# Patient Record
Sex: Female | Born: 1980 | Race: Black or African American | Hispanic: No | Marital: Single | Smoking: Former smoker
Health system: Southern US, Community
[De-identification: ages and names within clinical notes are randomized; demographics above are authoritative.]

## PROBLEM LIST (undated history)

## (undated) DIAGNOSIS — N920 Excessive and frequent menstruation with regular cycle: Secondary | ICD-10-CM

## (undated) DIAGNOSIS — Z5189 Encounter for other specified aftercare: Secondary | ICD-10-CM

## (undated) DIAGNOSIS — D649 Anemia, unspecified: Secondary | ICD-10-CM

## (undated) DIAGNOSIS — M1990 Unspecified osteoarthritis, unspecified site: Secondary | ICD-10-CM

## (undated) HISTORY — DX: Unspecified osteoarthritis, unspecified site: M19.90

## (undated) HISTORY — PX: MENISCUS REPAIR: SHX5179

## (undated) HISTORY — PX: KNEE SURGERY: SHX244

## (undated) HISTORY — PX: ROBOTIC ASSISTED LAPAROSCOPIC VAGINAL HYSTERECTOMY WITH FIBROID REMOVAL: SHX5387

## (undated) HISTORY — DX: Encounter for other specified aftercare: Z51.89

## (undated) HISTORY — DX: Anemia, unspecified: D64.9

## (undated) HISTORY — PX: KNEE ARTHROSCOPY W/ ACL RECONSTRUCTION: SHX1858

---

## 2014-04-28 ENCOUNTER — Encounter (HOSPITAL_COMMUNITY): Payer: Self-pay | Admitting: Emergency Medicine

## 2014-04-28 ENCOUNTER — Emergency Department (HOSPITAL_COMMUNITY)
Admission: EM | Admit: 2014-04-28 | Discharge: 2014-04-28 | Disposition: A | Discharge status: Home / Self Care | Payer: Non-veteran care | Attending: Emergency Medicine | Admitting: Emergency Medicine

## 2014-04-28 DIAGNOSIS — F17200 Nicotine dependence, unspecified, uncomplicated: Secondary | ICD-10-CM | POA: Insufficient documentation

## 2014-04-28 DIAGNOSIS — N946 Dysmenorrhea, unspecified: Secondary | ICD-10-CM | POA: Insufficient documentation

## 2014-04-28 MED ORDER — ONDANSETRON 4 MG PO TBDP
4.0000 mg | ORAL_TABLET | Freq: Once | ORAL | Status: AC
  Administered 2014-04-28: 4 mg via ORAL
  Filled 2014-04-28: qty 1

## 2014-04-28 MED ORDER — MORPHINE SULFATE 4 MG/ML IJ SOLN
4.0000 mg | Freq: Once | INTRAMUSCULAR | Status: AC
  Administered 2014-04-28: 4 mg via INTRAVENOUS
  Filled 2014-04-28 (×2): qty 1

## 2014-04-28 MED ORDER — HYDROCODONE-ACETAMINOPHEN 5-325 MG PO TABS: 2.0000 | ORAL_TABLET | ORAL | Status: DC | PRN

## 2014-04-28 MED ORDER — ONDANSETRON 4 MG PO TBDP: 4.0000 mg | ORAL_TABLET | Freq: Three times a day (TID) | ORAL | Status: DC | PRN

## 2014-04-28 NOTE — ED Provider Notes (Signed)
Medical screening examination/treatment/procedure(s) were performed by non-physician practitioner and as supervising physician I was immediately available for consultation/collaboration.   EKG Interpretation None        Hoy Morn, MD 04/28/14 [phone]

## 2014-04-28 NOTE — ED Notes (Signed)
Pt started her period today and is c/o menstrual cramping.  Hx of painful periods.

## 2014-04-28 NOTE — Discharge Instructions (Signed)
Take Vicodin as needed for pain. Take zofran as needed for nausea. Refer to attached documents for more information.  °

## 2014-04-28 NOTE — ED Provider Notes (Signed)
CSN: [id number]     Arrival date & time 04/28/14  1410 History   First MD Initiated Contact with Patient 04/28/14 1425     Chief Complaint  Patient presents with  . Dysmenorrhea     (Consider location/radiation/quality/duration/timing/severity/associated sxs/prior Treatment) HPI Comments: Patient is a 33 year old female who presents with abdominal pain that started today that is associated with her menstrual cycle that started today. The pain is located in her lower abdomen and does not radiate. The pain is described as cramping and severe. The pain started gradually and progressively worsened since the onset. Patient reports a history of painful periods. No alleviating/aggravating factors. The patient has tried nothing for symptoms without relief because she cannot keep anything down. Associated symptoms include nausea and vomiting. Patient denies fever, headache, diarrhea, chest pain, SOB, dysuria, constipation.   History reviewed. No pertinent past medical history. History reviewed. No pertinent past surgical history. History reviewed. No pertinent family history. History  Substance Use Topics  . Smoking status: Current Every Day Smoker  . Smokeless tobacco: Not on file  . Alcohol Use: No   OB History   Grav Para Term Preterm Abortions TAB SAB Ect Mult Living                 Review of Systems  Constitutional: Negative for fever, chills and fatigue.  HENT: Negative for trouble swallowing.   Eyes: Negative for visual disturbance.  Respiratory: Negative for shortness of breath.   Cardiovascular: Negative for chest pain and palpitations.  Gastrointestinal: Positive for abdominal pain. Negative for nausea, vomiting and diarrhea.  Genitourinary: Positive for vaginal bleeding. Negative for dysuria and difficulty urinating.  Musculoskeletal: Negative for arthralgias and neck pain.  Skin: Negative for color change.  Neurological: Negative for dizziness and weakness.   Psychiatric/Behavioral: Negative for dysphoric mood.      Allergies  Review of patient's allergies indicates no known allergies.  Home Medications   Prior to Admission medications   Medication Sig Start Date End Date Taking? Authorizing Provider  acetaminophen (TYLENOL) 500 MG tablet Take 500 mg by mouth every 6 (six) hours as needed.   Yes Historical Provider, MD  ibuprofen (ADVIL,MOTRIN) 200 MG tablet Take 200 mg by mouth every 6 (six) hours as needed for mild pain.   Yes Historical Provider, MD   BP 144/98  Pulse 78  Temp(Src) 98.2 F (36.8 C) (Oral)  Resp 18  SpO2 100%  LMP 04/28/2014 Physical Exam  Nursing note and vitals reviewed. Constitutional: She is oriented to person, place, and time. She appears well-developed and well-nourished. No distress.  HENT:  Head: Normocephalic and atraumatic.  Eyes: Conjunctivae and EOM are normal.  Neck: Normal range of motion.  Cardiovascular: Normal rate and regular rhythm.  Exam reveals no gallop and no friction rub.   No murmur heard. Pulmonary/Chest: Effort normal and breath sounds normal. She has no wheezes. She has no rales. She exhibits no tenderness.  Abdominal: Soft. She exhibits no distension. There is tenderness. There is no rebound and no guarding.  Generalized lower abdominal tenderness to palpation.   Musculoskeletal: Normal range of motion.  Neurological: She is alert and oriented to person, place, and time. Coordination normal.  Speech is goal-oriented. Moves limbs without ataxia.   Skin: Skin is warm and dry.  Psychiatric: She has a normal mood and affect. Her behavior is normal.    ED Course  Procedures (including critical care time) Labs Review Labs Reviewed - No data to display  Imaging Review No results found.   EKG Interpretation None      MDM   Final diagnoses:  Dysmenorrhea   2:41 PM Patient complaining of menstrual cramps. Patient will have IM morphine and odt zofran for nausea. Vitals  stable and patient afebrile. No other complaints at this time. Patient will be discharged with Vicodin and zofran.     Alvina Chou, [place] 04/28/14 [phone]

## 2014-04-28 NOTE — Progress Notes (Signed)
[facility] did not get to see patient but will be sending information about [facility], using address provided.

## 2014-06-24 ENCOUNTER — Observation Stay (HOSPITAL_COMMUNITY)
Admission: EM | Admit: 2014-06-24 | Discharge: 2014-06-25 | Disposition: A | Discharge status: Home / Self Care | Payer: Non-veteran care | Attending: Obstetrics & Gynecology | Admitting: Obstetrics & Gynecology

## 2014-06-24 ENCOUNTER — Other Ambulatory Visit: Payer: Self-pay | Admitting: Obstetrics & Gynecology

## 2014-06-24 ENCOUNTER — Encounter (HOSPITAL_COMMUNITY): Payer: Self-pay | Admitting: Emergency Medicine

## 2014-06-24 DIAGNOSIS — R112 Nausea with vomiting, unspecified: Secondary | ICD-10-CM | POA: Insufficient documentation

## 2014-06-24 DIAGNOSIS — F17200 Nicotine dependence, unspecified, uncomplicated: Secondary | ICD-10-CM | POA: Insufficient documentation

## 2014-06-24 DIAGNOSIS — D259 Leiomyoma of uterus, unspecified: Secondary | ICD-10-CM

## 2014-06-24 DIAGNOSIS — N921 Excessive and frequent menstruation with irregular cycle: Secondary | ICD-10-CM

## 2014-06-24 DIAGNOSIS — N946 Dysmenorrhea, unspecified: Secondary | ICD-10-CM

## 2014-06-24 DIAGNOSIS — N920 Excessive and frequent menstruation with regular cycle: Secondary | ICD-10-CM | POA: Diagnosis not present

## 2014-06-24 DIAGNOSIS — R109 Unspecified abdominal pain: Secondary | ICD-10-CM | POA: Diagnosis present

## 2014-06-24 LAB — COMPREHENSIVE METABOLIC PANEL
ALBUMIN: 4.2 g/dL (ref 3.5–5.2)
ALT: 13 U/L (ref 0–35)
AST: 17 U/L (ref 0–37)
Alkaline Phosphatase: 45 U/L (ref 39–117)
Anion gap: 15 (ref 5–15)
BUN: 10 mg/dL (ref 6–23)
CO2: 22 mEq/L (ref 19–32)
CREATININE: 0.79 mg/dL (ref 0.50–1.10)
Calcium: 9.7 mg/dL (ref 8.4–10.5)
Chloride: 106 mEq/L (ref 96–112)
GFR calc Af Amer: >90 mL/min (ref >90)
Glucose, Bld: 104 mg/dL — ABNORMAL HIGH (ref 70–99)
Potassium: 3.5 mEq/L — ABNORMAL LOW (ref 3.7–5.3)
Sodium: 143 mEq/L (ref 137–147)
TOTAL PROTEIN: 8.4 g/dL — AB (ref 6.0–8.3)
Total Bilirubin: 0.2 mg/dL — ABNORMAL LOW (ref 0.3–1.2)

## 2014-06-24 LAB — CBC WITH DIFFERENTIAL/PLATELET
BASOS PCT: 0 % (ref 0–1)
Basophils Absolute: 0 10*3/uL (ref 0.0–0.1)
EOS PCT: 0 % (ref 0–5)
Eosinophils Absolute: 0 10*3/uL (ref 0.0–0.7)
HEMATOCRIT: 26.5 % — AB (ref 36.0–46.0)
HEMOGLOBIN: 7.3 g/dL — AB (ref 12.0–15.0)
LYMPHS ABS: 0.9 10*3/uL (ref 0.7–4.0)
LYMPHS PCT: 11 % — AB (ref 12–46)
MCH: 19.2 pg — ABNORMAL LOW (ref 26.0–34.0)
MCHC: 27.5 g/dL — ABNORMAL LOW (ref 30.0–36.0)
MCV: 69.6 fL — AB (ref 78.0–100.0)
Monocytes Absolute: 0.5 10*3/uL (ref 0.1–1.0)
Monocytes Relative: 6 % (ref 3–12)
NEUTROS ABS: 6.4 10*3/uL (ref 1.7–7.7)
Neutrophils Relative %: 83 % — ABNORMAL HIGH (ref 43–77)
Platelets: 430 10*3/uL — ABNORMAL HIGH (ref 150–400)
RBC: 3.81 MIL/uL — AB (ref 3.87–5.11)
RDW: 17.8 % — AB (ref 11.5–15.5)
WBC: 7.8 10*3/uL (ref 4.0–10.5)

## 2014-06-24 LAB — URINALYSIS, ROUTINE W REFLEX MICROSCOPIC
Glucose, UA: NEGATIVE mg/dL
Ketones, ur: 15 mg/dL — AB
Leukocytes, UA: NEGATIVE
Nitrite: NEGATIVE
Protein, ur: 30 mg/dL — AB
Specific Gravity, Urine: 1.029 (ref 1.005–1.030)
Urobilinogen, UA: 1 mg/dL (ref 0.0–1.0)
pH: 6.5 (ref 5.0–8.0)

## 2014-06-24 LAB — POC URINE PREG, ED: PREG TEST UR: NEGATIVE

## 2014-06-24 LAB — URINE MICROSCOPIC-ADD ON

## 2014-06-24 LAB — WET PREP, GENITAL
TRICH WET PREP: NONE SEEN
Yeast Wet Prep HPF POC: NONE SEEN

## 2014-06-24 LAB — PREPARE RBC (CROSSMATCH)

## 2014-06-24 LAB — LIPASE, BLOOD: Lipase: 29 U/L (ref 11–59)

## 2014-06-24 MED ORDER — MEGESTROL ACETATE 40 MG PO TABS
80.0000 mg | ORAL_TABLET | Freq: Every day | ORAL | Status: DC
  Administered 2014-06-24: 80 mg via ORAL
  Filled 2014-06-24 (×2): qty 2

## 2014-06-24 MED ORDER — PROMETHAZINE HCL 25 MG/ML IJ SOLN: 25.0000 mg | Freq: Four times a day (QID) | INTRAMUSCULAR | Status: DC | PRN

## 2014-06-24 MED ORDER — MORPHINE SULFATE 4 MG/ML IJ SOLN
4.0000 mg | Freq: Once | INTRAMUSCULAR | Status: AC
  Administered 2014-06-24: 4 mg via INTRAVENOUS
  Filled 2014-06-24: qty 1

## 2014-06-24 MED ORDER — SIMETHICONE 80 MG PO CHEW: 80.0000 mg | CHEWABLE_TABLET | Freq: Four times a day (QID) | ORAL | Status: DC | PRN

## 2014-06-24 MED ORDER — SODIUM CHLORIDE 0.9 % IV BOLUS (SEPSIS)
1000.0000 mL | Freq: Once | INTRAVENOUS | Status: AC
  Administered 2014-06-24: 1000 mL via INTRAVENOUS

## 2014-06-24 MED ORDER — DIPHENHYDRAMINE HCL 25 MG PO CAPS
25.0000 mg | ORAL_CAPSULE | Freq: Once | ORAL | Status: AC
  Administered 2014-06-24: 25 mg via ORAL
  Filled 2014-06-24: qty 1

## 2014-06-24 MED ORDER — LACTATED RINGERS IV SOLN: INTRAVENOUS | Status: DC

## 2014-06-24 MED ORDER — IBUPROFEN 600 MG PO TABS
600.0000 mg | ORAL_TABLET | Freq: Four times a day (QID) | ORAL | Status: DC | PRN
  Administered 2014-06-25: 600 mg via ORAL
  Filled 2014-06-24: qty 1

## 2014-06-24 MED ORDER — ONDANSETRON HCL 4 MG/2ML IJ SOLN
4.0000 mg | Freq: Once | INTRAMUSCULAR | Status: AC
  Administered 2014-06-24: 4 mg via INTRAVENOUS
  Filled 2014-06-24: qty 2

## 2014-06-24 MED ORDER — ACETAMINOPHEN 325 MG PO TABS
650.0000 mg | ORAL_TABLET | Freq: Once | ORAL | Status: AC
  Administered 2014-06-24: 650 mg via ORAL
  Filled 2014-06-24 (×2): qty 2

## 2014-06-24 MED ORDER — MEGESTROL ACETATE 40 MG PO TABS
40.0000 mg | ORAL_TABLET | Freq: Three times a day (TID) | ORAL | Status: DC
Start: 2014-06-25 — End: 2014-06-25
  Administered 2014-06-25: 40 mg via ORAL
  Filled 2014-06-24: qty 1

## 2014-06-24 MED ORDER — ZOLPIDEM TARTRATE 5 MG PO TABS: 5.0000 mg | ORAL_TABLET | Freq: Every evening | ORAL | Status: DC | PRN

## 2014-06-24 NOTE — H&P (Signed)
Janice Duran is an 33 y.o. female who presents today for dysmenorrhea and menorrhagia. Janice Duran reports that her periods have gotten progressively heavier and more painful for years. In th last 5 months she has started to have nausea and vomiting with her menses. She is not able to keep any food or water down. She reports severe lower abdominal stabbing pain with menses as well. She has tried Tylenol Extra Strength (500 mg) without relief. In the last 5 months her periods have gone from 3-5 days to 5-7 days. She reports a very heavy flow, using ultra an ultra tampon and ultra pad together and having to change it every hour for the first 5 days. Days 6-7 she needs to change them every 2-3 hr. She endorses weakness and chills. Also endorses a left temporal headache but states that she gets headaches chronically, more than 15 days/month. Reports that sometimes she "sees dots" with headache but is not seeing them now and does not know if they are before, during or after the pain starts. She denies dizziness, fever, chest pain, shortness of breath, vision changes and use of birth control.   Patient states that her sister had similar symptoms and was diagnosed with uterine fibroids.   Pertinent Gynecological History: Menses: flow is excessive with use of 1 ultra pad and tampon together and changed hourly pads or tampons on heaviest days, regular every 21 days without intermenstrual spotting, usually lasting 5 to 7 days and with severe dysmenorrhea Bleeding: dysfunctional uterine bleeding Contraception: none DES exposure: unknown Blood transfusions: Ordered Sexually transmitted diseases: no past history Previous GYN Procedures: Colposcopy 2003  Last mammogram: N/A Last pap: normal Date: 2015 OB History: G0  Menstrual History: Menarche age: 89  Patient's last menstrual period was 06/21/2014.    History reviewed. No pertinent past medical history.  History reviewed. No pertinent past surgical  history.  No family history on file.  Social History:  reports that she has been smoking.  She does not have any smokeless tobacco history on file. She reports that she does not drink alcohol or use illicit drugs.  Allergies: No Known Allergies  Prescriptions prior to admission  Medication Sig Dispense Refill  . acetaminophen (TYLENOL) 500 MG tablet Take 500 mg by mouth every 6 (six) hours as needed.      Marland Kitchen ibuprofen (ADVIL,MOTRIN) 200 MG tablet Take 200 mg by mouth every 6 (six) hours as needed for mild pain.      Marland Kitchen ondansetron (ZOFRAN ODT) 4 MG disintegrating tablet Take 1 tablet (4 mg total) by mouth every 8 (eight) hours as needed for nausea or vomiting.  10 tablet  0    Review of Systems  Constitutional: Positive for chills. Negative for fever.  Eyes: Negative for blurred vision and double vision.  Respiratory: Negative for shortness of breath.   Cardiovascular: Negative for chest pain.  Gastrointestinal: Positive for nausea, vomiting and abdominal pain. Negative for diarrhea and constipation.  Neurological: Positive for weakness and headaches (Left Temporal- Chronic). Negative for dizziness.  Endo/Heme/Allergies: Does not bruise/bleed easily.    Blood pressure 115/75, pulse 69, temperature 97.9 F (36.6 C), temperature source Oral, resp. rate 18, last menstrual period 06/21/2014, SpO2 99.00%. Physical Exam  Constitutional: She is oriented to person, place, and time. She appears well-developed and well-nourished.  Cardiovascular: Normal rate, regular rhythm and normal heart sounds.  Exam reveals no friction rub.   No murmur heard. Respiratory: Effort normal and breath sounds normal. She has no wheezes. She  has no rales.  GI: Soft. She exhibits no distension and no mass. There is no tenderness. There is no rebound and no guarding.  Musculoskeletal: She exhibits no edema and no tenderness.  No edema or tenderness to palpation of lower extremities bilaterally. Negative Homan's  sign. Dorsalis pedis and posterior tibial pulse 3+ bilaterally.  Neurological: She is alert and oriented to person, place, and time.  Skin: Skin is warm and dry. She is not diaphoretic.  Psychiatric: Her behavior is normal.    Results for orders placed during the hospital encounter of 06/24/14 (from the past 24 hour(s))  CBC WITH DIFFERENTIAL     Status: Abnormal   Collection Time    06/24/14  5:17 PM      Result Value Ref Range   WBC 7.8  4.0 - 10.5 K/uL   RBC 3.81 (*) 3.87 - 5.11 MIL/uL   Hemoglobin 7.3 (*) 12.0 - 15.0 g/dL   HCT 26.5 (*) 36.0 - 46.0 %   MCV 69.6 (*) 78.0 - 100.0 fL   MCH 19.2 (*) 26.0 - 34.0 pg   MCHC 27.5 (*) 30.0 - 36.0 g/dL   RDW 17.8 (*) 11.5 - 15.5 %   Platelets 430 (*) 150 - 400 K/uL   Neutrophils Relative % 83 (*) 43 - 77 %   Lymphocytes Relative 11 (*) 12 - 46 %   Monocytes Relative 6  3 - 12 %   Eosinophils Relative 0  0 - 5 %   Basophils Relative 0  0 - 1 %   Neutro Abs 6.4  1.7 - 7.7 K/uL   Lymphs Abs 0.9  0.7 - 4.0 K/uL   Monocytes Absolute 0.5  0.1 - 1.0 K/uL   Eosinophils Absolute 0.0  0.0 - 0.7 K/uL   Basophils Absolute 0.0  0.0 - 0.1 K/uL   RBC Morphology TARGET CELLS     Smear Review LARGE PLATELETS PRESENT    COMPREHENSIVE METABOLIC PANEL     Status: Abnormal   Collection Time    06/24/14  5:17 PM      Result Value Ref Range   Sodium 143  137 - 147 mEq/L   Potassium 3.5 (*) 3.7 - 5.3 mEq/L   Chloride 106  96 - 112 mEq/L   CO2 22  19 - 32 mEq/L   Glucose, Bld 104 (*) 70 - 99 mg/dL   BUN 10  6 - 23 mg/dL   Creatinine, Ser 0.79  0.50 - 1.10 mg/dL   Calcium 9.7  8.4 - 10.5 mg/dL   Total Protein 8.4 (*) 6.0 - 8.3 g/dL   Albumin 4.2  3.5 - 5.2 g/dL   AST 17  0 - 37 U/L   ALT 13  0 - 35 U/L   Alkaline Phosphatase 45  39 - 117 U/L   Total Bilirubin 0.2 (*) 0.3 - 1.2 mg/dL   GFR calc non Af Amer >90  >90 mL/min   GFR calc Af Amer >90  >90 mL/min   Anion gap 15  5 - 15  LIPASE, BLOOD     Status: None   Collection Time    06/24/14   5:17 PM      Result Value Ref Range   Lipase 29  11 - 59 U/L  URINALYSIS, ROUTINE W REFLEX MICROSCOPIC     Status: Abnormal   Collection Time    06/24/14  6:00 PM      Result Value Ref Range   Color, Urine AMBER (*)  YELLOW   APPearance CLOUDY (*) CLEAR   Specific Gravity, Urine 1.029  1.005 - 1.030   pH 6.5  5.0 - 8.0   Glucose, UA NEGATIVE  NEGATIVE mg/dL   Hgb urine dipstick LARGE (*) NEGATIVE   Bilirubin Urine SMALL (*) NEGATIVE   Ketones, ur 15 (*) NEGATIVE mg/dL   Protein, ur 30 (*) NEGATIVE mg/dL   Urobilinogen, UA 1.0  0.0 - 1.0 mg/dL   Nitrite NEGATIVE  NEGATIVE   Leukocytes, UA NEGATIVE  NEGATIVE  URINE MICROSCOPIC-ADD ON     Status: Abnormal   Collection Time    06/24/14  6:00 PM      Result Value Ref Range   Squamous Epithelial / LPF RARE  RARE   WBC, UA 0-2  <3 WBC/hpf   RBC / HPF TOO NUMEROUS TO COUNT  <3 RBC/hpf   Bacteria, UA RARE  RARE   Casts HYALINE CASTS (*) NEGATIVE   Urine-Other MUCOUS PRESENT    POC URINE PREG, ED     Status: None   Collection Time    06/24/14  6:10 PM      Result Value Ref Range   Preg Test, Ur NEGATIVE  NEGATIVE  WET PREP, GENITAL     Status: Abnormal   Collection Time    06/24/14  6:45 PM      Result Value Ref Range   Yeast Wet Prep HPF POC NONE SEEN  NONE SEEN   Trich, Wet Prep NONE SEEN  NONE SEEN   Clue Cells Wet Prep HPF POC FEW (*) NONE SEEN   WBC, Wet Prep HPF POC FEW (*) NONE SEEN    No results found.  Assessment/Plan: A: Microcytic Anemia Menorrhagia Nausea/Vomiting  P: Current facility-administered medications:ibuprofen (ADVIL,MOTRIN) tablet 600 mg, 600 mg, Oral, Q6H PRN, Guss Bunde, MD;  lactated ringers infusion, , Intravenous, Continuous, Guss Bunde, MD;  Derrill Memo ON 06/25/2014] megestrol (MEGACE) tablet 40 mg, 40 mg, Oral, TID, Guss Bunde, MD;  promethazine (PHENERGAN) injection 25 mg, 25 mg, Intravenous, Q6H PRN, Guss Bunde, MD simethicone [facility]) chewable tablet 80 mg, 80 mg, Oral, QID  PRN, Guss Bunde, MD;  zolpidem (AMBIEN) tablet 5 mg, 5 mg, Oral, QHS PRN, Guss Bunde, MD  2 units of RBC to be administered Pelvic and transvaginal ultrasound ordered  Gaetano Hawthorne 06/24/2014, 10:55 PM

## 2014-06-24 NOTE — ED Notes (Signed)
Pt aware urine sample is needed 

## 2014-06-24 NOTE — ED Notes (Signed)
Pt c/o bilateral lower abdominal pain w/ menstrual cycle.  Pt reports [facility] to get an appointment w[facility].

## 2014-06-24 NOTE — ED Provider Notes (Signed)
CSN: [id number]     Arrival date & time 06/24/14  1614 History   First MD Initiated Contact with Patient 06/24/14 1641     Chief Complaint  Patient presents with  . Emesis  . Abdominal Pain     (Consider location/radiation/quality/duration/timing/severity/associated sxs/prior Treatment) The history is provided by the patient. No language interpreter was used.  Janice Duran is a 33 y/o F with no known significant PMHx presenting to the ED with menstrual cramping with associated nausea and vomiting. Patient reported that she started her menstrual cycle 4 days ago and stated that when she starts her menstrual cycle she gets nausea and vomiting. Stated that the pelvic discomfort is a severe cramping sensation localized to the suprapubic region without radiation. Patient reported that she vomited at least 10 times today - NB/NB. Reported that she has been using Tylenol extra strength without any relief. Stated that she has a heavy flow during her menstrual cycles - which is normal for her. Stated that she wears super tampons and pads - stated that she needs to change at least every hour or two hours. Reported that she has been feeling dizzy and weak the past couple of days. Patient reported that she has an appointment with OBGYN on June 30, 2014. Denied fever, chills, chest pain, shortness of breath, difficulty breathing, neck pain, neck stiffness, blurred vision, sudden loss of vision, dizziness, melena, hematochezia, diarrhea, birth control. PCP none  History reviewed. No pertinent past medical history. History reviewed. No pertinent past surgical history. No family history on file. History  Substance Use Topics  . Smoking status: Current Every Day Smoker  . Smokeless tobacco: Not on file  . Alcohol Use: No   OB History   Grav Para Term Preterm Abortions TAB SAB Ect Mult Living                 Review of Systems  Constitutional: Negative for fever and chills.  Respiratory: Negative for  chest tightness and shortness of breath.   Cardiovascular: Negative for chest pain.  Gastrointestinal: Positive for nausea and vomiting. Negative for diarrhea, constipation, blood in stool and anal bleeding.  Genitourinary: Positive for vaginal bleeding and pelvic pain. Negative for dysuria, vaginal discharge and vaginal pain.  Neurological: Negative for weakness and numbness.      Allergies  Review of patient's allergies indicates no known allergies.  Home Medications   Prior to Admission medications   Medication Sig Start Date End Date Taking? Authorizing Provider  acetaminophen (TYLENOL) 500 MG tablet Take 500 mg by mouth every 6 (six) hours as needed.   Yes Historical Provider, MD  ibuprofen (ADVIL,MOTRIN) 200 MG tablet Take 200 mg by mouth every 6 (six) hours as needed for mild pain.   Yes Historical Provider, MD  ondansetron (ZOFRAN ODT) 4 MG disintegrating tablet Take 1 tablet (4 mg total) by mouth every 8 (eight) hours as needed for nausea or vomiting. 04/28/14  Yes Kaitlyn Szekalski, PA-C   BP 113/57  Pulse 78  Temp(Src) 98.1 F (36.7 C) (Oral)  Resp 16  SpO2 99%  LMP 06/21/2014 Physical Exam  Nursing note and vitals reviewed. Constitutional: She is oriented to person, place, and time. She appears well-developed and well-nourished. No distress.  HENT:  Head: Normocephalic and atraumatic.  Mouth/Throat: Oropharynx is clear and moist. No oropharyngeal exudate.  Eyes: Conjunctivae and EOM are normal. Pupils are equal, round, and reactive to light. Right eye exhibits no discharge. Left eye exhibits no discharge.  Neck: Normal  range of motion. Neck supple. No tracheal deviation present.  Cardiovascular: Normal rate, regular rhythm and normal heart sounds.  Exam reveals no friction rub.   No murmur heard. Pulmonary/Chest: Effort normal and breath sounds normal. No respiratory distress. She has no wheezes. She has no rales.  Abdominal: Soft. Bowel sounds are normal. She  exhibits no distension. There is tenderness. There is no rebound and no guarding.  Negative abdominal distension  BS normoactive in all 4 quadrants Abdomen soft upon palpation  Negative peritoneal signs Negative guarding or rigidity noted Discomfort upon palpation to the suprapubic region   Genitourinary:  Pelvic exam: negative swelling, erythema, inflammation, lesions, sores, deformities noted to the external genitalia. Negative lesions, sores, deformities, masses noted to the vaginal canal. Bright red blood and blood clots noted to the vaginal vault. Negative CMT and bilateral adnexal tenderness.  Exam chaperoned with tech.   Musculoskeletal: Normal range of motion.  Full ROM to upper and lower extremities without difficulty noted, negative ataxia noted.  Lymphadenopathy:    She has no cervical adenopathy.  Neurological: She is alert and oriented to person, place, and time. No cranial nerve deficit. She exhibits normal muscle tone. Coordination normal.  Cranial nerves III-XII grossly intact Strength 5+/5+ to upper and lower extremities bilaterally with resistance applied, equal distribution noted Equal grip strength bilaterally  Negative facial drooping Negative slurred speech  Negative aphasia GCS 15 Patient is able to follow commands well and appropriately   Skin: Skin is warm and dry. No rash noted. She is not diaphoretic. No erythema.  Psychiatric: She has a normal mood and affect. Her behavior is normal. Thought content normal.    ED Course  Procedures (including critical care time)  7:49 PM This provider spoke with Dr. Gala Romney, [facility] physician - discussed case, history, labs, imaging in great detail. Discussed vitals. Patient to be transferred to [place] per [facility] physician, recommended megace 80 mg to be given prior to arrival to the ED.   Results for orders placed during the hospital encounter of 06/24/14  WET PREP, GENITAL      Result Value Ref Range   Yeast  Wet Prep HPF POC NONE SEEN  NONE SEEN   Trich, Wet Prep NONE SEEN  NONE SEEN   Clue Cells Wet Prep HPF POC FEW (*) NONE SEEN   WBC, Wet Prep HPF POC FEW (*) NONE SEEN  CBC WITH DIFFERENTIAL      Result Value Ref Range   WBC 7.8  4.0 - 10.5 K/uL   RBC 3.81 (*) 3.87 - 5.11 MIL/uL   Hemoglobin 7.3 (*) 12.0 - 15.0 g/dL   HCT 26.5 (*) 36.0 - 46.0 %   MCV 69.6 (*) 78.0 - 100.0 fL   MCH 19.2 (*) 26.0 - 34.0 pg   MCHC 27.5 (*) 30.0 - 36.0 g/dL   RDW 17.8 (*) 11.5 - 15.5 %   Platelets 430 (*) 150 - 400 K/uL   Neutrophils Relative % 83 (*) 43 - 77 %   Lymphocytes Relative 11 (*) 12 - 46 %   Monocytes Relative 6  3 - 12 %   Eosinophils Relative 0  0 - 5 %   Basophils Relative 0  0 - 1 %   Neutro Abs 6.4  1.7 - 7.7 K/uL   Lymphs Abs 0.9  0.7 - 4.0 K/uL   Monocytes Absolute 0.5  0.1 - 1.0 K/uL   Eosinophils Absolute 0.0  0.0 - 0.7 K/uL   Basophils Absolute  0.0  0.0 - 0.1 K/uL   RBC Morphology TARGET CELLS     Smear Review LARGE PLATELETS PRESENT    COMPREHENSIVE METABOLIC PANEL      Result Value Ref Range   Sodium 143  137 - 147 mEq/L   Potassium 3.5 (*) 3.7 - 5.3 mEq/L   Chloride 106  96 - 112 mEq/L   CO2 22  19 - 32 mEq/L   Glucose, Bld 104 (*) 70 - 99 mg/dL   BUN 10  6 - 23 mg/dL   Creatinine, Ser 0.79  0.50 - 1.10 mg/dL   Calcium 9.7  8.4 - 10.5 mg/dL   Total Protein 8.4 (*) 6.0 - 8.3 g/dL   Albumin 4.2  3.5 - 5.2 g/dL   AST 17  0 - 37 U/L   ALT 13  0 - 35 U/L   Alkaline Phosphatase 45  39 - 117 U/L   Total Bilirubin 0.2 (*) 0.3 - 1.2 mg/dL   GFR calc non Af Amer >90  >90 mL/min   GFR calc Af Amer >90  >90 mL/min   Anion gap 15  5 - 15  LIPASE, BLOOD      Result Value Ref Range   Lipase 29  11 - 59 U/L  URINALYSIS, ROUTINE W REFLEX MICROSCOPIC      Result Value Ref Range   Color, Urine AMBER (*) YELLOW   APPearance CLOUDY (*) CLEAR   Specific Gravity, Urine 1.029  1.005 - 1.030   pH 6.5  5.0 - 8.0   Glucose, UA NEGATIVE  NEGATIVE mg/dL   Hgb urine dipstick LARGE (*)  NEGATIVE   Bilirubin Urine SMALL (*) NEGATIVE   Ketones, ur 15 (*) NEGATIVE mg/dL   Protein, ur 30 (*) NEGATIVE mg/dL   Urobilinogen, UA 1.0  0.0 - 1.0 mg/dL   Nitrite NEGATIVE  NEGATIVE   Leukocytes, UA NEGATIVE  NEGATIVE  URINE MICROSCOPIC-ADD ON      Result Value Ref Range   Squamous Epithelial / LPF RARE  RARE   WBC, UA 0-2  <3 WBC/hpf   RBC / HPF TOO NUMEROUS TO COUNT  <3 RBC/hpf   Bacteria, UA RARE  RARE   Casts HYALINE CASTS (*) NEGATIVE   Urine-Other MUCOUS PRESENT    POC URINE PREG, ED      Result Value Ref Range   Preg Test, Ur NEGATIVE  NEGATIVE    Labs Review Labs Reviewed  WET PREP, GENITAL - Abnormal; Notable for the following:    Clue Cells Wet Prep HPF POC FEW (*)    WBC, Wet Prep HPF POC FEW (*)    All other components within normal limits  CBC WITH DIFFERENTIAL - Abnormal; Notable for the following:    RBC 3.81 (*)    Hemoglobin 7.3 (*)    HCT 26.5 (*)    MCV 69.6 (*)    MCH 19.2 (*)    MCHC 27.5 (*)    RDW 17.8 (*)    Platelets 430 (*)    Neutrophils Relative % 83 (*)    Lymphocytes Relative 11 (*)    All other components within normal limits  COMPREHENSIVE METABOLIC PANEL - Abnormal; Notable for the following:    Potassium 3.5 (*)    Glucose, Bld 104 (*)    Total Protein 8.4 (*)    Total Bilirubin 0.2 (*)    All other components within normal limits  URINALYSIS, ROUTINE W REFLEX MICROSCOPIC - Abnormal; Notable for the following:  Color, Urine AMBER (*)    APPearance CLOUDY (*)    Hgb urine dipstick LARGE (*)    Bilirubin Urine SMALL (*)    Ketones, ur 15 (*)    Protein, ur 30 (*)    All other components within normal limits  URINE MICROSCOPIC-ADD ON - Abnormal; Notable for the following:    Casts HYALINE CASTS (*)    All other components within normal limits  GC/CHLAMYDIA PROBE AMP  LIPASE, BLOOD  POC URINE PREG, ED  TYPE AND SCREEN    Imaging Review No results found.   EKG Interpretation None      MDM   Final diagnoses:   Menorrhagia with irregular cycle  Dysmenorrhea    Medications  lactated ringers infusion (not administered)  ibuprofen (ADVIL,MOTRIN) tablet 600 mg (not administered)  simethicone (MYLICON) chewable tablet 80 mg (not administered)  zolpidem (AMBIEN) tablet 5 mg (not administered)  megestrol (MEGACE) tablet 40 mg (not administered)  promethazine (PHENERGAN) injection 25 mg (not administered)  sodium chloride 0.9 % bolus 1,000 mL (0 mLs Intravenous Stopped 06/24/14 2048)  morphine 4 MG/ML injection 4 mg (4 mg Intravenous Given 06/24/14 1754)  ondansetron (ZOFRAN) injection 4 mg (4 mg Intravenous Given 06/24/14 1754)  morphine 4 MG/ML injection 4 mg (4 mg Intravenous Given 06/24/14 2038)  acetaminophen (TYLENOL) tablet 650 mg (650 mg Oral Given 06/24/14 2149)  diphenhydrAMINE (BENADRYL) capsule 25 mg (25 mg Oral Given 06/24/14 2149)   Filed Vitals:   06/24/14 1629 06/24/14 1925  BP: 140/79 113/57  Pulse: 88 78  Temp: 98.7 F (37.1 C) 98.1 F (36.7 C)  TempSrc: Oral Oral  Resp: 22 16  SpO2: 100% 99%   CBC negative elevated white blood cell count. Hemoglobin 7.3, hematocrit 26.5. CMP noted mildly low potassium at 3.5-mild hypokalemia. BUN 10, creatinine 0.79. Negative elevated AST, ALT, alkaline phosphatase, bilirubin. Glucose 104, anion gap 15.3 mEq per liter. Lipase negative elevation. Urine pregnancy negative. Urinalysis noted large hemoglobin with negative nitrites or leukocytes-RBCs too numerous to count. Wet prep negative clue cells, few white blood cells noted. GC Chlamydia probe pending. Type and screen pending.  Patient mildly orthostatic. Patient placed on IV fluids and IV pain medications with relief. Nausea controlled. Pelvic exam identified bright red blood per vaginal vault with blood clots. Patient has low Hgb of 7.3 and presents with dizziness, light-headed - symptomatic anemia secondary to menorrhagia. This provider spoke with OBGYN who agreed to transfer, accepted under the  care of Dr. Gala Romney- recommended 80 mg of Megace to be administered as per [facility] physician. Discussed plan for transfer with patient who understood and agreed to plan of care. Patient stable for transfer.   Jamse Mead, PA-C 06/25/14 0122

## 2014-06-24 NOTE — ED Notes (Signed)
Per pt, increased vomiting with menstrual cycle

## 2014-06-25 ENCOUNTER — Observation Stay (HOSPITAL_COMMUNITY): Payer: Non-veteran care

## 2014-06-25 DIAGNOSIS — N920 Excessive and frequent menstruation with regular cycle: Principal | ICD-10-CM

## 2014-06-25 DIAGNOSIS — D259 Leiomyoma of uterus, unspecified: Secondary | ICD-10-CM

## 2014-06-25 LAB — CBC WITH DIFFERENTIAL/PLATELET
BLASTS: 0 %
Band Neutrophils: 0 % (ref 0–10)
Basophils Absolute: 0.1 10*3/uL (ref 0.0–0.1)
Basophils Relative: 1 % (ref 0–1)
Eosinophils Absolute: 0 10*3/uL (ref 0.0–0.7)
Eosinophils Relative: 0 % (ref 0–5)
HCT: 23.3 % — ABNORMAL LOW (ref 36.0–46.0)
Hemoglobin: 6.5 g/dL — CL (ref 12.0–15.0)
Lymphocytes Relative: 49 % — ABNORMAL HIGH (ref 12–46)
Lymphs Abs: 3.5 10*3/uL (ref 0.7–4.0)
MCH: 19.3 pg — AB (ref 26.0–34.0)
MCHC: 27.9 g/dL — ABNORMAL LOW (ref 30.0–36.0)
MCV: 69.3 fL — AB (ref 78.0–100.0)
METAMYELOCYTES PCT: 0 %
Monocytes Absolute: 0.1 10*3/uL (ref 0.1–1.0)
Monocytes Relative: 2 % — ABNORMAL LOW (ref 3–12)
Myelocytes: 0 %
NEUTROS ABS: 3.5 10*3/uL (ref 1.7–7.7)
NEUTROS PCT: 48 % (ref 43–77)
NRBC: 0 /100{WBCs}
PROMYELOCYTES ABS: 0 %
Platelets: 395 10*3/uL (ref 150–400)
RBC: 3.36 MIL/uL — ABNORMAL LOW (ref 3.87–5.11)
RDW: 18.3 % — AB (ref 11.5–15.5)
WBC: 7.2 10*3/uL (ref 4.0–10.5)

## 2014-06-25 LAB — CBC
HCT: 29.1 % — ABNORMAL LOW (ref 36.0–46.0)
Hemoglobin: 8.8 g/dL — ABNORMAL LOW (ref 12.0–15.0)
MCH: 22.4 pg — ABNORMAL LOW (ref 26.0–34.0)
MCHC: 30.2 g/dL (ref 30.0–36.0)
MCV: 74.2 fL — ABNORMAL LOW (ref 78.0–100.0)
Platelets: 290 10*3/uL (ref 150–400)
RBC: 3.92 MIL/uL (ref 3.87–5.11)
RDW: 19.3 % — ABNORMAL HIGH (ref 11.5–15.5)
WBC: 4.4 10*3/uL (ref 4.0–10.5)

## 2014-06-25 LAB — ABO/RH: ABO/RH(D): A POS

## 2014-06-25 MED ORDER — MEGESTROL ACETATE 40 MG PO TABS: 40.0000 mg | ORAL_TABLET | Freq: Two times a day (BID) | ORAL | Status: DC

## 2014-06-25 MED ORDER — OXYCODONE-ACETAMINOPHEN 5-325 MG PO TABS
2.0000 | ORAL_TABLET | Freq: Once | ORAL | Status: AC
  Administered 2014-06-25: 2 via ORAL
  Filled 2014-06-25: qty 2

## 2014-06-25 NOTE — Discharge Summary (Signed)
Physician Discharge Summary  Patient ID: Janice Duran MRN: [id number] DOB/AGE: 1981/04/05 33 y.o.  Admit date: 06/24/2014 Discharge date: 06/25/2014  Admission Diagnoses:Menorrhagia, anemia, fibroid uterus  Discharge Diagnoses: same Active Problems:   Menorrhagia   Fibroid uterus   Discharged Condition: good  Hospital Course: 33 yo.gp Patient's last menstrual period was 06/21/2014. Presented with heavy menses and dysmenorrhea with symptomatic anemia, required transfusion 2 units PRBC for relief.  Consults: None  Significant Diagnostic Studies: labs:  CBC Latest Ref Rng 06/25/2014 06/24/2014 06/24/2014  WBC 4.0 - 10.5 K/uL 4.4 7.2 7.8  Hemoglobin 12.0 - 15.0 g/dL 8.8(L) 6.5(LL) 7.3(L)  Hematocrit 36.0 - 46.0 % 29.1(L) 23.3(L) 26.5(L)  Platelets 150 - 400 K/uL 290 395 430(H)     and radiology: Ultrasound: showed uterine fibroids  Treatments: IV hydration and transfusion  Discharge Exam: Blood pressure 106/74, pulse 54, temperature 98.1 F (36.7 C), temperature source Oral, resp. rate 16, height 5\' 2"  (1.575 m), weight 143 lb (64.864 kg), last menstrual period 06/21/2014, SpO2 99.00%. General appearance: alert, cooperative and no distress GI: soft, non-tender; bowel sounds normal; no masses,  no organomegaly  Disposition: 01-Home or Self Care     Medication List         acetaminophen 500 MG tablet  Commonly known as:  TYLENOL  Take 500 mg by mouth every 6 (six) hours as needed.     ibuprofen 200 MG tablet  Commonly known as:  ADVIL,MOTRIN  Take 200 mg by mouth every 6 (six) hours as needed for mild pain.     megestrol 40 MG tablet  Commonly known as:  MEGACE  Take 1 tablet (40 mg total) by mouth 2 (two) times daily.     ondansetron 4 MG disintegrating tablet  Commonly known as:  ZOFRAN ODT  Take 1 tablet (4 mg total) by mouth every 8 (eight) hours as needed for nausea or vomiting.           Follow-up Information   Follow up with [facility]. Call in 1 week.      Signed: ARNOLD,JAMES 06/25/2014, 10:51 AM

## 2014-06-25 NOTE — Progress Notes (Signed)
CRITICAL VALUE ALERT  Critical value received:  Hgb 6.5  Date of notification:  06/25/2014  Time of notification:  0050  Critical value read back: yes  Nurse who received alert:  Neva Seat, RN  MD notified (1st page):  Gaetano Hawthorne, Student-PA  Time of first page:  0054  MD notified (2nd page):  Time of second page:  Responding MD:  Gaetano Hawthorne, Talitha Givens  Time MD responded:  [phone]

## 2014-06-25 NOTE — Progress Notes (Signed)
UR chart review completed.  

## 2014-06-25 NOTE — Progress Notes (Signed)
Pt discharged to home with partner.  Condition stable.  Pt ambulated to car with L. Graylon Good, NT.  No equipment for home at discharge.

## 2014-06-26 LAB — TYPE AND SCREEN
ABO/RH(D): A POS
ANTIBODY SCREEN: NEGATIVE
UNIT DIVISION: 0
Unit division: 0

## 2014-06-26 LAB — GC/CHLAMYDIA PROBE AMP
CT Probe RNA: NEGATIVE
GC PROBE AMP APTIMA: NEGATIVE

## 2014-06-28 NOTE — ED Provider Notes (Signed)
Medical screening examination/treatment/procedure(s) were conducted as a shared visit with non-physician practitioner(s) and myself.  I personally evaluated the patient during the encounter.   EKG Interpretation None      Patient here with abdominal pain, vaginal bleeding. Persistent menorrhagia. Hgb 7.3, transfer to [facility]. Megace given.   Osvaldo Shipper, MD 06/28/14 2312

## 2014-10-20 ENCOUNTER — Encounter (HOSPITAL_COMMUNITY): Payer: Self-pay | Admitting: Emergency Medicine

## 2014-10-20 ENCOUNTER — Observation Stay (HOSPITAL_COMMUNITY)
Admission: EM | Admit: 2014-10-20 | Discharge: 2014-10-21 | Disposition: A | Discharge status: Home / Self Care | Payer: Non-veteran care | Attending: Family Medicine | Admitting: Family Medicine

## 2014-10-20 DIAGNOSIS — I82441 Acute embolism and thrombosis of right tibial vein: Secondary | ICD-10-CM | POA: Diagnosis present

## 2014-10-20 DIAGNOSIS — D259 Leiomyoma of uterus, unspecified: Secondary | ICD-10-CM | POA: Diagnosis not present

## 2014-10-20 DIAGNOSIS — I82491 Acute embolism and thrombosis of other specified deep vein of right lower extremity: Secondary | ICD-10-CM | POA: Diagnosis not present

## 2014-10-20 DIAGNOSIS — N920 Excessive and frequent menstruation with regular cycle: Secondary | ICD-10-CM | POA: Insufficient documentation

## 2014-10-20 DIAGNOSIS — Z79899 Other long term (current) drug therapy: Secondary | ICD-10-CM | POA: Diagnosis not present

## 2014-10-20 DIAGNOSIS — F17210 Nicotine dependence, cigarettes, uncomplicated: Secondary | ICD-10-CM | POA: Insufficient documentation

## 2014-10-20 DIAGNOSIS — D649 Anemia, unspecified: Secondary | ICD-10-CM

## 2014-10-20 DIAGNOSIS — N921 Excessive and frequent menstruation with irregular cycle: Secondary | ICD-10-CM

## 2014-10-20 DIAGNOSIS — I82431 Acute embolism and thrombosis of right popliteal vein: Secondary | ICD-10-CM | POA: Diagnosis not present

## 2014-10-20 DIAGNOSIS — M79609 Pain in unspecified limb: Secondary | ICD-10-CM

## 2014-10-20 DIAGNOSIS — I82401 Acute embolism and thrombosis of unspecified deep veins of right lower extremity: Secondary | ICD-10-CM

## 2014-10-20 DIAGNOSIS — M79606 Pain in leg, unspecified: Secondary | ICD-10-CM | POA: Diagnosis present

## 2014-10-20 DIAGNOSIS — D62 Acute posthemorrhagic anemia: Secondary | ICD-10-CM | POA: Diagnosis present

## 2014-10-20 HISTORY — DX: Excessive and frequent menstruation with regular cycle: N92.0

## 2014-10-20 LAB — CBC WITH DIFFERENTIAL/PLATELET
Basophils Absolute: 0 10*3/uL (ref 0.0–0.1)
Basophils Relative: 0 % (ref 0–1)
EOS ABS: 0.1 10*3/uL (ref 0.0–0.7)
Eosinophils Relative: 2 % (ref 0–5)
HCT: 20.9 % — ABNORMAL LOW (ref 36.0–46.0)
Hemoglobin: 6 g/dL — CL (ref 12.0–15.0)
Lymphocytes Relative: 34 % (ref 12–46)
Lymphs Abs: 1.9 10*3/uL (ref 0.7–4.0)
MCH: 21.6 pg — AB (ref 26.0–34.0)
MCHC: 28.7 g/dL — ABNORMAL LOW (ref 30.0–36.0)
MCV: 75.2 fL — AB (ref 78.0–100.0)
MONOS PCT: 11 % (ref 3–12)
Monocytes Absolute: 0.6 10*3/uL (ref 0.1–1.0)
Neutro Abs: 3.1 10*3/uL (ref 1.7–7.7)
Neutrophils Relative %: 54 % (ref 43–77)
PLATELETS: 319 10*3/uL (ref 150–400)
RBC: 2.78 MIL/uL — ABNORMAL LOW (ref 3.87–5.11)
RDW: 17.4 % — ABNORMAL HIGH (ref 11.5–15.5)
WBC: 5.7 10*3/uL (ref 4.0–10.5)

## 2014-10-20 LAB — ABO/RH: ABO/RH(D): A POS

## 2014-10-20 LAB — I-STAT CHEM 8, ED
BUN: 5 mg/dL — AB (ref 6–23)
CHLORIDE: 109 meq/L (ref 96–112)
CREATININE: 0.9 mg/dL (ref 0.50–1.10)
Calcium, Ion: 1.23 mmol/L (ref 1.12–1.23)
Glucose, Bld: 88 mg/dL (ref 70–99)
HCT: 22 % — ABNORMAL LOW (ref 36.0–46.0)
Hemoglobin: 7.5 g/dL — ABNORMAL LOW (ref 12.0–15.0)
POTASSIUM: 3.8 meq/L (ref 3.7–5.3)
SODIUM: 139 meq/L (ref 137–147)
TCO2: 20 mmol/L (ref 0–100)

## 2014-10-20 LAB — PROTIME-INR
INR: 1.02 (ref 0.00–1.49)
PROTHROMBIN TIME: 13.5 s (ref 11.6–15.2)

## 2014-10-20 LAB — I-STAT BETA HCG BLOOD, ED (MC, WL, AP ONLY): I-stat hCG, quantitative: <5 m[IU]/mL (ref <5)

## 2014-10-20 LAB — PREPARE RBC (CROSSMATCH)

## 2014-10-20 MED ORDER — HYDROCODONE-ACETAMINOPHEN 5-325 MG PO TABS
1.0000 | ORAL_TABLET | Freq: Four times a day (QID) | ORAL | Status: DC | PRN
  Administered 2014-10-21: 1 via ORAL
  Administered 2014-10-21: 2 via ORAL
  Filled 2014-10-20: qty 1
  Filled 2014-10-20: qty 2

## 2014-10-20 MED ORDER — MEGESTROL ACETATE 40 MG PO TABS
120.0000 mg | ORAL_TABLET | Freq: Every day | ORAL | Status: DC
  Administered 2014-10-21: 120 mg via ORAL
  Filled 2014-10-20 (×3): qty 3

## 2014-10-20 MED ORDER — SODIUM CHLORIDE 0.9 % IJ SOLN
3.0000 mL | Freq: Two times a day (BID) | INTRAMUSCULAR | Status: DC
  Administered 2014-10-21: 3 mL via INTRAVENOUS

## 2014-10-20 MED ORDER — ACETAMINOPHEN 500 MG PO TABS
500.0000 mg | ORAL_TABLET | Freq: Four times a day (QID) | ORAL | Status: DC | PRN
  Administered 2014-10-21: 500 mg via ORAL
  Filled 2014-10-20: qty 1

## 2014-10-20 MED ORDER — ENOXAPARIN SODIUM 60 MG/0.6ML ~~LOC~~ SOLN: 1.0000 mg/kg | SUBCUTANEOUS | Status: DC

## 2014-10-20 MED ORDER — RIVAROXABAN 15 MG PO TABS
15.0000 mg | ORAL_TABLET | Freq: Once | ORAL | Status: DC
  Filled 2014-10-20: qty 1

## 2014-10-20 MED ORDER — TRAMADOL HCL 50 MG PO TABS: 50.0000 mg | ORAL_TABLET | Freq: Two times a day (BID) | ORAL | Status: DC | PRN

## 2014-10-20 MED ORDER — ENOXAPARIN SODIUM 80 MG/0.8ML ~~LOC~~ SOLN
1.0000 mg/kg | Freq: Two times a day (BID) | SUBCUTANEOUS | Status: DC
  Filled 2014-10-20: qty 0.8

## 2014-10-20 MED ORDER — SODIUM CHLORIDE 0.9 % IV SOLN
10.0000 mL/h | Freq: Once | INTRAVENOUS | Status: AC
  Administered 2014-10-20: 10 mL/h via INTRAVENOUS

## 2014-10-20 MED ORDER — RIVAROXABAN 15 MG PO TABS
15.0000 mg | ORAL_TABLET | Freq: Once | ORAL | Status: DC
  Administered 2014-10-20: 15 mg via ORAL

## 2014-10-20 MED ORDER — RIVAROXABAN 15 MG PO TABS: 15.0000 mg | ORAL_TABLET | Freq: Once | ORAL | Status: DC

## 2014-10-20 NOTE — Progress Notes (Signed)
VASCULAR LAB PRELIMINARY  PRELIMINARY  PRELIMINARY  PRELIMINARY  Bilateral lower extremity venous duplex completed.    Preliminary report:  Right - Positive for deep vein thrombosis of the peroneal, posterior tibial, and popliteal veins. There is no evidence of a superficial thrombosis. Positive for a small fluid filled space in the popliteal fossa consistent with a non ruptured Baker's cyst. Left lower extremity was evaluated for a DVT per protocol for a patient positive for a DVT in the requested extremity  Janice Duran, RVS 10/20/2014, 9:10 PM

## 2014-10-20 NOTE — H&P (Signed)
[facility] History and Physical  Janice Duran:[id number] DOB: Jan 16, 1981 DOA: 10/20/2014  Referring physician: EDP PCP: No PCP Per Patient   Chief Complaint: Leg pain   HPI: Janice Duran is a 33 y.o. female with history of menorrhagia.  She was most recently started on nuvaring for menorrhagia after depoprovera failed to work.  Nuvaring stopped her menorrhagia, however the patient also smokes, and today presents to the ED with R calf and R foot pain and mild swelling.  Symptoms onset Sunday morning when she woke up, has progressively worsened, pain is worse with walking and movement.  [place] confirms DVT.  Lab work also demonstrates anemia with HGB of 6.0.  She has required transfusion previously for the menorrhagia and her baseline only runs 7-8.  EDP has ordered 2 units PRBC and requested admission for obs for the transfusion.  Patient received 1 dose of xarelto in ED.  Review of Systems: Systems reviewed.  As above, otherwise negative  Past Medical History  Diagnosis Date  . Menorrhagia    Past Surgical History  Procedure Laterality Date  . Knee surgery     Social History:  reports that she has been smoking.  She does not have any smokeless tobacco history on file. She reports that she does not drink alcohol or use illicit drugs.  No Known Allergies  History reviewed. No pertinent family history.   Prior to Admission medications   Medication Sig Start Date End Date Taking? Authorizing Provider  acetaminophen (TYLENOL) 500 MG tablet Take 500 mg by mouth every 6 (six) hours as needed.   Yes Historical Provider, MD  traMADol (ULTRAM) 50 MG tablet Take 50 mg by mouth 2 (two) times daily as needed for moderate pain or severe pain.   Yes Historical Provider, MD  ibuprofen (ADVIL,MOTRIN) 200 MG tablet Take 200 mg by mouth every 6 (six) hours as needed for mild pain.    Historical Provider, MD  megestrol (MEGACE) 40 MG tablet Take 1 tablet (40 mg total) by mouth 2 (two)  times daily. 06/25/14   Woodroe Mode, MD  ondansetron (ZOFRAN ODT) 4 MG disintegrating tablet Take 1 tablet (4 mg total) by mouth every 8 (eight) hours as needed for nausea or vomiting. 04/28/14   Alvina Chou, PA-C   Physical Exam: Filed Vitals:   10/20/14 2258  BP: 117/76  Pulse: 87  Temp:   Resp: 18    BP 117/76 mmHg  Pulse 87  Temp(Src) 98.6 F (37 C) (Oral)  Resp 18  Ht 5\' 2"  (1.575 m)  Wt 63.504 kg (140 lb)  BMI 25.60 kg/m2  SpO2 100%  General Appearance:    Alert, oriented, no distress, appears stated age  Head:    Normocephalic, atraumatic  Eyes:    PERRL, EOMI, sclera non-icteric        Nose:   Nares without drainage or epistaxis. Mucosa, turbinates normal  Throat:   Moist mucous membranes. Oropharynx without erythema or exudate.  Neck:   Supple. No carotid bruits.  No thyromegaly.  No lymphadenopathy.   Back:     No CVA tenderness, no spinal tenderness  Lungs:     Clear to auscultation bilaterally, without wheezes, rhonchi or rales  Chest wall:    No tenderness to palpitation  Heart:    Regular rate and rhythm without murmurs, gallops, rubs  Abdomen:     Soft, non-tender, nondistended, normal bowel sounds, no organomegaly  Genitalia:    deferred  Rectal:  deferred  Extremities:   No clubbing, cyanosis or edema.  Pulses:   2+ and symmetric all extremities  Skin:   Skin color, texture, turgor normal, no rashes or lesions  Lymph nodes:   Cervical, supraclavicular, and axillary nodes normal  Neurologic:   CNII-XII intact. Normal strength, sensation and reflexes      throughout    Labs on Admission:  Basic Metabolic Panel:  Recent Labs Lab 10/20/14 2135  NA 139  K 3.8  CL 109  GLUCOSE 88  BUN 5*  CREATININE 0.90   Liver Function Tests: No results for input(s): AST, ALT, ALKPHOS, BILITOT, PROT, ALBUMIN in the last 168 hours. No results for input(s): LIPASE, AMYLASE in the last 168 hours. No results for input(s): AMMONIA in the last 168  hours. CBC:  Recent Labs Lab 10/20/14 2126 10/20/14 2135  WBC 5.7  --   NEUTROABS 3.1  --   HGB 6.0* 7.5*  HCT 20.9* 22.0*  MCV 75.2*  --   PLT 319  --    Cardiac Enzymes: No results for input(s): CKTOTAL, CKMB, CKMBINDEX, TROPONINI in the last 168 hours.  BNP (last 3 results) No results for input(s): PROBNP in the last 8760 hours. CBG: No results for input(s): GLUCAP in the last 168 hours.  Radiological Exams on Admission: No results found.  EKG: Independently reviewed.  Assessment/Plan Principal Problem:   Acute deep vein thrombosis (DVT) of popliteal vein of right lower extremity Active Problems:   Menorrhagia   Fibroid uterus   Acute deep vein thrombosis (DVT) of tibial vein of right lower extremity   Anemia associated with acute blood loss   1. DVT - got 1 dose of xarelto in ED 1. Have ordered lovenox to be started 12 hours from now instead of xarelto as she is at risk for developing bleeding in the near future after discharge with her extensive history of menorrhagia 2. Discussed that even this was risky and that if she developed bleeding while on blood thinners she needed to be seen emergently in the ED again 2. Menorrhagia 1. EDP spoke with Dr. Frederich Balding who rec: Megestrol 120mg  daily, transfuse, d/c with anticoagulants but needs outpatient follow up for definitive management of menorrhagia 2. Suspect patient will ultimately need quick follow up and probably ablation at this point after multiple failed medical therapies. 3. No active bleeding at this time to contraindicate starting anticoagulants for DVT 3. Acute on chronic blood loss anemia 1. Transfusing 2 units PRBC 2. Repeat CBC in AM    Code Status: Full Code  Family Communication: No family in room Disposition Plan: Admit to obs   Time spent: 70 min  Darwin Guastella M. [facility] Pager [phone]  If 7AM-7PM, please contact the day team taking care of the patient [email or web address] Password  [facility] 10/20/2014, 11:32 PM

## 2014-10-20 NOTE — ED Notes (Signed)
Blood Consent has been signed by patient and witnessed by Duard Larsen, RN.

## 2014-10-20 NOTE — ED Notes (Signed)
Placed patient on 5 lead cardiac monitor.

## 2014-10-20 NOTE — ED Notes (Signed)
Pt c/o rt sided calf pain since Sunday.  Denies redness or swelling. Positive homan's sign.  Was put on nuvaring a week ago.

## 2014-10-20 NOTE — ED Notes (Signed)
Spoke with Vascular tech- states"it will be awhile, she is currently at [facility] then headed to [facility](as they called her first), then she will come here."

## 2014-10-20 NOTE — ED Notes (Signed)
Provided patient a heating pack for leg pain.

## 2014-10-20 NOTE — ED Notes (Signed)
Ultrasound at bedside. Patient appears in no distress while procedure is being performed.

## 2014-10-20 NOTE — ED Provider Notes (Signed)
CSN: [id number]     Arrival date & time 10/20/14  1727 History   First MD Initiated Contact with Patient 10/20/14 1817     Chief Complaint  Patient presents with  . Leg Pain     (Consider location/radiation/quality/duration/timing/severity/associated sxs/prior Treatment) HPI Janice Duran is a 33 y.o. female who presents to emergency department complaining of right calf and right foot pain. Patient states her pain began Sunday morning, 3 days ago when she woke up. States it felt like a charley horse in the right calf. States that progressively pain has radiated into the right medial ankle and arch of the foot. She reports mild swelling. Pain with walking and movement of the ankle. Also tender to palpation over the calf. Patient states she tried taking over-the-counter pain medications, tried to roll out her foot on the tennis ball, tried to stretch with no relief of her symptoms.she does report starting on NuvaRing one week ago. No recent travel or surgeries. Denies injuries.  History reviewed. No pertinent past medical history. Past Surgical History  Procedure Laterality Date  . Knee surgery     History reviewed. No pertinent family history. History  Substance Use Topics  . Smoking status: Current Every Day Smoker  . Smokeless tobacco: Not on file  . Alcohol Use: No   OB History    No data available     Review of Systems  Constitutional: Negative for fever and chills.  Respiratory: Negative for cough, chest tightness and shortness of breath.   Cardiovascular: Negative for chest pain, palpitations and leg swelling.  Gastrointestinal: Negative for nausea, vomiting, abdominal pain and diarrhea.  Genitourinary: Negative for dysuria and flank pain.  Musculoskeletal: Positive for myalgias and arthralgias. Negative for back pain, neck pain and neck stiffness.  Skin: Negative for rash.  Neurological: Negative for dizziness, weakness and headaches.  All other systems reviewed and are  negative.     Allergies  Review of patient's allergies indicates no known allergies.  Home Medications   Prior to Admission medications   Medication Sig Start Date End Date Taking? Authorizing Provider  acetaminophen (TYLENOL) 500 MG tablet Take 500 mg by mouth every 6 (six) hours as needed.   Yes Historical Provider, MD  etonogestrel-ethinyl estradiol (NUVARING) 0.12-0.015 MG/24HR vaginal ring Place 1 each vaginally every 28 (twenty-eight) days. Insert vaginally and leave in place for 3 consecutive weeks, then remove for 1 week.   Yes Historical Provider, MD  traMADol (ULTRAM) 50 MG tablet Take 50 mg by mouth 2 (two) times daily as needed for moderate pain or severe pain.   Yes Historical Provider, MD  ibuprofen (ADVIL,MOTRIN) 200 MG tablet Take 200 mg by mouth every 6 (six) hours as needed for mild pain.    Historical Provider, MD  megestrol (MEGACE) 40 MG tablet Take 1 tablet (40 mg total) by mouth 2 (two) times daily. 06/25/14   Woodroe Mode, MD  ondansetron (ZOFRAN ODT) 4 MG disintegrating tablet Take 1 tablet (4 mg total) by mouth every 8 (eight) hours as needed for nausea or vomiting. 04/28/14   Kaitlyn Szekalski, PA-C   BP 147/83 mmHg  Pulse 99  Temp(Src) 98.7 F (37.1 C) (Oral)  Resp 16  SpO2 100% Physical Exam  Constitutional: She appears well-developed and well-nourished. No distress.  HENT:  Head: Normocephalic.  Eyes: Conjunctivae are normal.  Neck: Neck supple.  Cardiovascular: Normal rate, regular rhythm, normal heart sounds and intact distal pulses.   Pulmonary/Chest: Effort normal and breath sounds normal.  No respiratory distress. She has no wheezes. She has no rales.  Abdominal: Soft. Bowel sounds are normal. She exhibits no distension. There is no tenderness. There is no rebound.  Musculoskeletal:  No obvious swelling of the right lower leg compared to the left. Tender to palpation over right calf, tenderness over medial posterior tibial tendons just behind the  medial malleolus. Tenderness over right arch of the foot. Tender to palpation over the calf, positive Homans sign.  Neurological: She is alert.  Skin: Skin is warm and dry.  Psychiatric: She has a normal mood and affect. Her behavior is normal.  Nursing note and vitals reviewed.   ED Course  Procedures (including critical care time) Labs Review Labs Reviewed  CBC WITH DIFFERENTIAL - Abnormal; Notable for the following:    RBC 2.78 (*)    Hemoglobin 6.0 (*)    HCT 20.9 (*)    MCV 75.2 (*)    MCH 21.6 (*)    MCHC 28.7 (*)    RDW 17.4 (*)    All other components within normal limits  I-STAT CHEM 8, ED - Abnormal; Notable for the following:    BUN 5 (*)    Hemoglobin 7.5 (*)    HCT 22.0 (*)    All other components within normal limits  PROTIME-INR  HEMOGLOBIN AND HEMATOCRIT, BLOOD  CBC  I-STAT BETA HCG BLOOD, ED ([facility], [facility], [facility] ONLY)  TYPE AND SCREEN  PREPARE RBC (CROSSMATCH)    Imaging Review No results found.   EKG Interpretation None       VASCULAR LAB PRELIMINARY PRELIMINARY PRELIMINARY PRELIMINARY  Bilateral lower extremity venous duplex completed.   Preliminary report: Right - Positive for deep vein thrombosis of the peroneal, posterior tibial, and popliteal veins. There is no evidence of a superficial thrombosis. Positive for a small fluid filled space in the popliteal fossa consistent with a non ruptured Baker's cyst. Left lower extremity was evaluated for a DVT per protocol for a patient positive for a DVT in the requested extremity  SLAUGHTER, [place], RVS 10/20/2014, 9:10 PM      MDM   Final diagnoses:  Anemia, unspecified anemia type  DVT (deep venous thrombosis), right    6:30 PM Patient seen and examined. Pain in the right calf, right arch of the foot, symptoms and physical exam findings concerning for DVT, will get Dopplers to rule out DVT.  10:43 PM Positive for DVT of peroneal, popitial and posterior tibial veins. No signs of PE. Pt's  Hgb is 6. Pt has hx of fibrous uterus and heavy vaginal bleeding. Prior transfusion. Discussed starting on anticoagulation vs watching DVT given her anemia and heavy bleeding. Discussed smoking cessations and nuvaring was removed yesterday. Discussed with Dr. Kellie Simmering with vascular, advised anticoagulation is recommended unless there is a contraindication, in which case aspirin should be given. At this time, given pt's hgb is 6, will transfuse and admit to inpatient.   Filed Vitals:   10/20/14 1737 10/20/14 1940 10/20/14 2114  BP: 147/83 111/56 122/82  Pulse: 99 95 97  Temp: 98.7 F (37.1 C) 99.6 F (37.6 C) 98.6 F (37 C)  TempSrc: Oral Oral Oral  Resp: 16 20 20   SpO2: 100% 100% 100%    11:11 PM Discussed with Dr. Frederich Balding with OB/GYN, advised to start pt on Megestrol 120mg  daily. Transfuse. D/c with outpatient follow up for definitive management of menorrhagia. Pt received a doze of xarelto when originally ordered.     Renold Genta, PA-C 10/20/14  [place], MD 10/30/14 [phone]

## 2014-10-21 LAB — CBC
HCT: 21.5 % — ABNORMAL LOW (ref 36.0–46.0)
HCT: 27.9 % — ABNORMAL LOW (ref 36.0–46.0)
HEMOGLOBIN: 6.1 g/dL — AB (ref 12.0–15.0)
Hemoglobin: 8.6 g/dL — ABNORMAL LOW (ref 12.0–15.0)
MCH: 23.8 pg — ABNORMAL LOW (ref 26.0–34.0)
MCH: 21.3 pg — AB (ref 26.0–34.0)
MCHC: 30.8 g/dL (ref 30.0–36.0)
MCHC: 28.4 g/dL — AB (ref 30.0–36.0)
MCV: 77.1 fL — ABNORMAL LOW (ref 78.0–100.0)
MCV: 74.9 fL — AB (ref 78.0–100.0)
PLATELETS: 325 10*3/uL (ref 150–400)
Platelets: UNDETERMINED 10*3/uL (ref 150–400)
RBC: 2.87 MIL/uL — ABNORMAL LOW (ref 3.87–5.11)
RBC: 3.62 MIL/uL — ABNORMAL LOW (ref 3.87–5.11)
RDW: 17.3 % — ABNORMAL HIGH (ref 11.5–15.5)
RDW: 16.6 % — ABNORMAL HIGH (ref 11.5–15.5)
WBC: 5.7 10*3/uL (ref 4.0–10.5)
WBC: 4.5 10*3/uL (ref 4.0–10.5)

## 2014-10-21 LAB — HEMOGLOBIN AND HEMATOCRIT, BLOOD
HCT: 20.4 % — ABNORMAL LOW (ref 36.0–46.0)
HEMOGLOBIN: 5.9 g/dL — AB (ref 12.0–15.0)

## 2014-10-21 MED ORDER — FERROUS SULFATE 325 (65 FE) MG PO TABS
325.0000 mg | ORAL_TABLET | Freq: Three times a day (TID) | ORAL | Status: DC
  Administered 2014-10-21: 325 mg via ORAL
  Filled 2014-10-21 (×2): qty 1

## 2014-10-21 MED ORDER — TRAMADOL HCL 50 MG PO TABS: 50.0000 mg | ORAL_TABLET | Freq: Two times a day (BID) | ORAL | Status: DC | PRN

## 2014-10-21 MED ORDER — DABIGATRAN ETEXILATE MESYLATE 150 MG PO CAPS
150.0000 mg | ORAL_CAPSULE | Freq: Two times a day (BID) | ORAL | Status: DC
  Administered 2014-10-21: 150 mg via ORAL
  Filled 2014-10-21: qty 1

## 2014-10-21 MED ORDER — MEGESTROL ACETATE 40 MG PO TABS: 120.0000 mg | ORAL_TABLET | Freq: Every day | ORAL | Status: DC

## 2014-10-21 MED ORDER — FERROUS SULFATE 325 (65 FE) MG PO TABS: 325.0000 mg | ORAL_TABLET | Freq: Three times a day (TID) | ORAL | Status: DC

## 2014-10-21 MED ORDER — DABIGATRAN ETEXILATE MESYLATE 150 MG PO CAPS: 150.0000 mg | ORAL_CAPSULE | Freq: Two times a day (BID) | ORAL | Status: DC

## 2014-10-21 NOTE — Plan of Care (Signed)
Problem: Consults Goal: GI Bleeding Patient Education See Patient Education Module for education specifics.  Outcome: Completed/Met Date Met:  10/21/14 Education on menorrhagia Goal: Skin Care Protocol Initiated - if Braden Score 18 or less If consults are not indicated, leave blank or document N/A  Outcome: Not Applicable Date Met:  48/54/62 Goal: Nutrition Consult-if indicated Outcome: Not Applicable Date Met:  70/35/00 Goal: Diabetes Guidelines if Diabetic/Glucose > 140 If diabetic or lab glucose is > 140 mg/dl - Initiate Diabetes/Hyperglycemia Guidelines & Document Interventions  Outcome: Not Applicable Date Met:  93/81/82  Problem: Phase I Progression Outcomes Goal: Pain controlled with appropriate interventions Outcome: Completed/Met Date Met:  10/21/14 Goal: OOB as tolerated unless otherwise ordered Outcome: Completed/Met Date Met:  10/21/14 Goal: Initial discharge plan identified Outcome: Completed/Met Date Met:  10/21/14 Goal: Voiding-avoid urinary catheter unless indicated Outcome: Completed/Met Date Met:  10/21/14 Goal: Hemodynamically stable Outcome: Completed/Met Date Met:  10/21/14 Goal: Other Phase I Outcomes/Goals Outcome: Completed/Met Date Met:  10/21/14  Problem: Phase II Progression Outcomes Goal: No active bleeding Outcome: Completed/Met Date Met:  10/21/14 Goal: Hemodynamically stable Outcome: Completed/Met Date Met:  10/21/14 Goal: H&H stablized < 1gm drop in 24 hrs Outcome: Completed/Met Date Met:  10/21/14 Goal: Progress activity as tolerated unless otherwise ordered Outcome: Completed/Met Date Met:  10/21/14 Goal: Tolerating diet Outcome: Completed/Met Date Met:  10/21/14 Goal: Other Phase II Outcomes/Goals Outcome: Completed/Met Date Met:  10/21/14  Problem: Phase III Progression Outcomes Goal: Activity at appropriate level-compared to baseline (UP IN CHAIR FOR HEMODIALYSIS)  Outcome: Completed/Met Date Met:  10/21/14 Goal: Discharge plan  remains appropriate-arrangements made Outcome: Completed/Met Date Met:  10/21/14 Goal: H&H stablized <1gm drop in 24 hrs, no active bleeding Outcome: Completed/Met Date Met:  10/21/14 Goal: Other Phase III Outcomes/Goals Outcome: Completed/Met Date Met:  10/21/14  Problem: Discharge Progression Outcomes Goal: Barriers To Progression Addressed/Resolved Outcome: Completed/Met Date Met:  10/21/14 Goal: Discharge plan in place and appropriate Outcome: Completed/Met Date Met:  10/21/14 Goal: Pain controlled with appropriate interventions Outcome: Completed/Met Date Met:  10/21/14 Goal: Stools guaiac negative Outcome: Not Applicable Date Met:  99/37/16 Pt admitted with menorrhagia, no bleeding in stools Goal: Hemodynamically stable Outcome: Completed/Met Date Met:  10/21/14 Goal: Tolerating diet Outcome: Completed/Met Date Met:  10/21/14 Goal: Activity appropriate for discharge plan Outcome: Completed/Met Date Met:  10/21/14 Goal: [place] arrangements in place Outcome: Not Applicable Date Met:  96/78/93 Goal: Other Discharge Outcomes/Goals Outcome: Completed/Met Date Met:  10/21/14

## 2014-10-21 NOTE — Care Management Note (Signed)
    Page 1 of 1   10/21/2014     2:27:18 PM CARE MANAGEMENT NOTE 10/21/2014  Patient:  Janice Duran, Janice Duran   Account Number:  [id number]  Date Initiated:  10/21/2014  Documentation initiated by:  Dessa Phi  Subjective/Objective Assessment:   33 Y/O F ADMITTED W/DVT.     Action/Plan:   FROM HOME.PCP-DR. LAZARDO W/S [facility].   Anticipated DC Date:  10/21/2014   Anticipated DC Plan:  [place]  CM consult      Choice offered to / List presented to:             Status of service:  Completed, signed off Medicare Important Message given?   (If response is "NO", the following Medicare IM given date fields will be blank) Date Medicare IM given:   Medicare IM given by:   Date Additional Medicare IM given:   Additional Medicare IM given by:    Discharge Disposition:  HOME/SELF CARE  Per UR Regulation:  Reviewed for med. necessity/level of care/duration of stay  If discussed at [place] of Stay Meetings, dates discussed:    Comments:  10/22/14 Lamere Lightner RN,BSN NCM [phone] D/C HOME NO [place].

## 2014-10-21 NOTE — Progress Notes (Signed)
UR completed 

## 2014-10-21 NOTE — Discharge Summary (Signed)
Physician Discharge Summary  Janice Duran IEP:[id number] DOB: 30-Jul-1981 DOA: 10/20/2014  PCP: No PCP Per Patient  Admit date: 10/20/2014 Discharge date: 10/21/2014  Time spent: >35 minutes  Recommendations for Outpatient Follow-up:  1. Please be sure to follow up with your OBGYN physician on discharge 2. Encourage nicotine cessation 3. Monitor Hgb levels 4. Encourage ferrous sulfate supplementation  5. Will be discharge on Pradaxa (given that there is antidote for this medication) in lieu of history of menorrhagia. No bleeding was reported on d/c  Discharge Diagnoses:  Principal Problem:   Acute deep vein thrombosis (DVT) of popliteal vein of right lower extremity Active Problems:   Menorrhagia   Fibroid uterus   Acute deep vein thrombosis (DVT) of tibial vein of right lower extremity   Anemia associated with acute blood loss   Acute blood loss anemia   Discharge Condition: stable  Diet recommendation: regular diet  Filed Weights   10/20/14 2258 10/21/14 0028  Weight: 63.504 kg (140 lb) 68.5 kg (151 lb 0.2 oz)    History of present illness:  According to HPI: Janice Duran is a 33 y.o. female with history of menorrhagia. She was most recently started on nuvaring for menorrhagia after depoprovera failed to work. Nuvaring stopped her menorrhagia, however the patient also smokes, and today presents to the ED with R calf and R foot pain and mild swelling. Symptoms onset Sunday morning when she woke up, has progressively worsened, pain is worse with walking and movement. [place] confirms DVT.  Hospital Course:  Anemia - Most likely due to chronic bleeding from menorrhagia - ceased on Megace 120 mg po daily. Will d/c on this regimen - Pt required transfusion x 2 units.  - Provide ferrous sulfate script on d/c  Acute DVT - Place on pradaxa as this has antidote should patient continue to bleed. No bleeding on discharge day. - Will have patient f/u with her OBGYN  physician  Nicotine dependence - recommended cessation.  Procedures:  NOne  Consultations:  Discussed with OBGYN  Discharge Exam: Filed Vitals:   10/21/14 0659  BP: 107/67  Pulse: 67  Temp: 98.1 F (36.7 C)  Resp: 14    General: Pt in nad, alert and awake Cardiovascular: rrr, no mrg Respiratory: cta bl, no wheezes  Discharge Instructions You were cared for by a hospitalist during your hospital stay. If you have any questions about your discharge medications or the care you received while you were in the hospital after you are discharged, you can call the unit and asked to speak with the hospitalist on call if the hospitalist that took care of you is not available. Once you are discharged, your primary care physician will handle any further medical issues. Please note that NO REFILLS for any discharge medications will be authorized once you are discharged, as it is imperative that you return to your primary care physician (or establish a relationship with a primary care physician if you do not have one) for your aftercare needs so that they can reassess your need for medications and monitor your lab values.  Discharge Instructions    Call MD for:  extreme fatigue    Complete by:  As directed      Call MD for:  temperature >100.4    Complete by:  As directed      Diet - low sodium heart healthy    Complete by:  As directed      Discharge instructions    Complete by:  As directed   Should you develop any bleeding while on Pradaxa you are to go to the Emergency Department for further evaluation.  Also you are to take your Ferrous sulfate 325 mg po three times daily     Increase activity slowly    Complete by:  As directed           Current Discharge Medication List    START taking these medications   Details  dabigatran (PRADAXA) 150 MG CAPS capsule Take 1 capsule (150 mg total) by mouth every 12 (twelve) hours. Qty: 60 capsule, Refills: 0    ferrous sulfate 325 (65  FE) MG tablet Take 1 tablet (325 mg total) by mouth 3 (three) times daily with meals. Qty: 90 tablet, Refills: 0      CONTINUE these medications which have CHANGED   Details  megestrol (MEGACE) 40 MG tablet Take 3 tablets (120 mg total) by mouth daily. Qty: 80 tablet, Refills: 0    traMADol (ULTRAM) 50 MG tablet Take 1 tablet (50 mg total) by mouth 2 (two) times daily as needed for moderate pain or severe pain. Qty: 30 tablet, Refills: 0      CONTINUE these medications which have NOT CHANGED   Details  acetaminophen (TYLENOL) 500 MG tablet Take 500 mg by mouth every 6 (six) hours as needed.    ibuprofen (ADVIL,MOTRIN) 200 MG tablet Take 200 mg by mouth every 6 (six) hours as needed for mild pain.    ondansetron (ZOFRAN ODT) 4 MG disintegrating tablet Take 1 tablet (4 mg total) by mouth every 8 (eight) hours as needed for nausea or vomiting. Qty: 10 tablet, Refills: 0       No Known Allergies    The results of significant diagnostics from this hospitalization (including imaging, microbiology, ancillary and laboratory) are listed below for reference.    Significant Diagnostic Studies: No results found.  Microbiology: No results found for this or any previous visit (from the past 240 hour(s)).   Labs: Basic Metabolic Panel:  Recent Labs Lab 10/20/14 2135  NA 139  K 3.8  CL 109  GLUCOSE 88  BUN 5*  CREATININE 0.90   Liver Function Tests: No results for input(s): AST, ALT, ALKPHOS, BILITOT, PROT, ALBUMIN in the last 168 hours. No results for input(s): LIPASE, AMYLASE in the last 168 hours. No results for input(s): AMMONIA in the last 168 hours. CBC:  Recent Labs Lab 10/20/14 2126 10/20/14 2135 10/20/14 2344 10/21/14 0051 10/21/14 1000  WBC 5.7  --   --  5.7 4.5  NEUTROABS 3.1  --   --   --   --   HGB 6.0* 7.5* 5.9* 6.1* 8.6*  HCT 20.9* 22.0* 20.4* 21.5* 27.9*  MCV 75.2*  --   --  74.9* 77.1*  PLT 319  --   --  325 PLATELET CLUMPS NOTED ON SMEAR, UNABLE TO  ESTIMATE   Cardiac Enzymes: No results for input(s): CKTOTAL, CKMB, CKMBINDEX, TROPONINI in the last 168 hours. BNP: BNP (last 3 results) No results for input(s): PROBNP in the last 8760 hours. CBG: No results for input(s): GLUCAP in the last 168 hours.     Signed:  Velvet Bathe  [facility] 10/21/2014, 11:02 AM

## 2014-10-22 LAB — TYPE AND SCREEN
ABO/RH(D): A POS
ANTIBODY SCREEN: NEGATIVE
UNIT DIVISION: 0
Unit division: 0

## 2015-03-18 ENCOUNTER — Emergency Department (HOSPITAL_COMMUNITY)
Admission: EM | Admit: 2015-03-18 | Discharge: 2015-03-18 | Disposition: A | Discharge status: Home / Self Care | Payer: Non-veteran care | Attending: Emergency Medicine | Admitting: Emergency Medicine

## 2015-03-18 ENCOUNTER — Encounter (HOSPITAL_COMMUNITY): Payer: Self-pay | Admitting: Emergency Medicine

## 2015-03-18 DIAGNOSIS — Z8742 Personal history of other diseases of the female genital tract: Secondary | ICD-10-CM | POA: Insufficient documentation

## 2015-03-18 DIAGNOSIS — Z79899 Other long term (current) drug therapy: Secondary | ICD-10-CM | POA: Insufficient documentation

## 2015-03-18 DIAGNOSIS — Z720 Tobacco use: Secondary | ICD-10-CM | POA: Insufficient documentation

## 2015-03-18 DIAGNOSIS — M25562 Pain in left knee: Secondary | ICD-10-CM | POA: Insufficient documentation

## 2015-03-18 MED ORDER — NAPROXEN 500 MG PO TABS: 500.0000 mg | ORAL_TABLET | Freq: Two times a day (BID) | ORAL | Status: DC

## 2015-03-18 NOTE — ED Provider Notes (Signed)
CSN: [id number]     Arrival date & time 03/18/15  2109 History  This chart was scribed for Waynetta Pean, PA-C working with Orpah Greek, MD by Randa Evens, ED Scribe. This patient was seen in room TR08C/TR08C and the patient's care was started at 9:47 PM.      Chief Complaint  Patient presents with  . Knee Pain   The history is provided by the patient. No language interpreter was used.   HPI Comments: Janice Duran is a 34 y.o. female who presents to the Emergency Department complaining of chronic left knee pain that recently worsened today at 5 PM after work. Pt rates the severity of her pain 8/10. Pt states she has associated swelling. Pt reports several knee surgeries including ACL repair and scopes. Pt denies new injury or trauma. Pt state she has tried ibuprofen with no relief. Pt states her last MRI showed "2 tears" in the knee and states that she may need a knee replacement. She reports she is followed by an orthopedic surgeon at the [facility] and they state she needs a knee replacement. She is requesting help with her pain tonight. Pt denies fever or gait problem. She denies any numbness or tingling.    Past Medical History  Diagnosis Date  . Menorrhagia    Past Surgical History  Procedure Laterality Date  . Knee surgery     No family history on file. History  Substance Use Topics  . Smoking status: Current Every Day Smoker  . Smokeless tobacco: Not on file  . Alcohol Use: No   OB History    No data available      Review of Systems  Constitutional: Negative for fever.  Musculoskeletal: Positive for arthralgias. Negative for back pain and gait problem.  Skin: Negative for color change, rash and wound.  Neurological: Negative for weakness and numbness.  All other systems reviewed and are negative.   Allergies  Review of patient's allergies indicates no known allergies.  Home Medications   Prior to Admission medications   Medication Sig Start Date  End Date Taking? Authorizing Provider  acetaminophen (TYLENOL) 500 MG tablet Take 500 mg by mouth every 6 (six) hours as needed.    Historical Provider, MD  dabigatran (PRADAXA) 150 MG CAPS capsule Take 1 capsule (150 mg total) by mouth every 12 (twelve) hours. 10/21/14   Velvet Bathe, MD  ferrous sulfate 325 (65 FE) MG tablet Take 1 tablet (325 mg total) by mouth 3 (three) times daily with meals. 10/21/14   Velvet Bathe, MD  megestrol (MEGACE) 40 MG tablet Take 3 tablets (120 mg total) by mouth daily. 10/21/14   Velvet Bathe, MD  naproxen (NAPROSYN) 500 MG tablet Take 1 tablet (500 mg total) by mouth 2 (two) times daily with a meal. 03/18/15   Waynetta Pean, PA-C  ondansetron (ZOFRAN ODT) 4 MG disintegrating tablet Take 1 tablet (4 mg total) by mouth every 8 (eight) hours as needed for nausea or vomiting. 04/28/14   Alvina Chou, PA-C  traMADol (ULTRAM) 50 MG tablet Take 1 tablet (50 mg total) by mouth 2 (two) times daily as needed for moderate pain or severe pain. 10/21/14   Velvet Bathe, MD   BP 137/65 mmHg  Pulse 91  Temp(Src) 98.1 F (36.7 C) (Oral)  Resp 16  Ht 5\' 2"  (1.575 m)  Wt 143 lb (64.864 kg)  BMI 26.15 kg/m2  SpO2 97%  LMP 02/15/2015 (Approximate)   Physical Exam  Constitutional: She  appears well-developed and well-nourished. No distress.  HENT:  Head: Normocephalic and atraumatic.  Eyes: Right eye exhibits no discharge. Left eye exhibits no discharge.  Cardiovascular: Normal rate, regular rhythm, normal heart sounds and intact distal pulses.   Bilateral DP and PT pulses intact.  Pulmonary/Chest: Effort normal and breath sounds normal. No respiratory distress. She has no wheezes.  Musculoskeletal: She exhibits edema and tenderness.  Very mild edema noted her her medial aspect of her left knee.  No erythema. Full passive range of motion. No calf swelling, no calf tenderness bilaterally. No knee laxity noted. Patient is able to ambulate without assistance. Anatlgic gait.    Neurological: She is alert. Coordination normal.  sensation intact in her bilateral lower extremities.   Skin: Skin is warm and dry. No rash noted. She is not diaphoretic. No erythema. No pallor.  Psychiatric: She has a normal mood and affect. Her behavior is normal.  Nursing note and vitals reviewed.   ED Course  Procedures (including critical care time) DIAGNOSTIC STUDIES: Oxygen Saturation is 97% on RA, normal by my interpretation.    COORDINATION OF CARE: 9:59 PM-Discussed treatment plan with pt at bedside and pt agreed to plan.     Labs Review Labs Reviewed - No data to display  Imaging Review No results found.   EKG Interpretation None      Filed Vitals:   03/18/15 2137  BP: 137/65  Pulse: 91  Temp: 98.1 F (36.7 C)  TempSrc: Oral  Resp: 16  Height: 5\' 2"  (1.575 m)  Weight: 143 lb (64.864 kg)  SpO2: 97%     MDM   Meds given in ED:  Medications - No data to display  Discharge Medication List as of 03/18/2015 10:03 PM    START taking these medications   Details  naproxen (NAPROSYN) 500 MG tablet Take 1 tablet (500 mg total) by mouth 2 (two) times daily with a meal., Starting 03/18/2015, Until Discontinued, Print        Final diagnoses:  Left knee pain   This is a 34 year old female with a history of chronic right knee pain who presents to the emergency department complaining of worsening knee pain since today. She reports using ibuprofen today without relief. She reports she is followed by an orthopedic surgeon at the [facility] who tells her she needs a knee replacement. Reports having chronic swelling and pain in her knee that is worsened today. The patient does have a mild amount of edema to the medial aspect of her knee. There is no overlying erythema. Patient is able to move her knee, just with pain. She reports it is worse with walking. No concern for septic joint. Her last knee surgery was 6 years ago. She is requesting help with pain control today. She  does not want an x-ray. Will have her stop taking ibuprofen and switch her to naproxen 500 mg twice a day. I educated on RICE and to use her knee sleeve that she reports she has at home. I advised she could use tylenol for worsening pain. The maximum daily dose of acetaminophen was discussed with the patient. She was encouraged not to exceed 4,000 mg of acetaminophen during a 24 hour period and was asked to keep in mind that acetaminophen can also be found in many over-the-counter cold medications as well as narcotics that may be given for pain. The patient expresses understanding of these issues and questions were answered. I advised her to follow-up with her orthopedic surgeon next  week. I advised the patient to follow-up with their primary care provider this week. I advised the patient to return to the emergency department with new or worsening symptoms or new concerns. The patient verbalized understanding and agreement with plan.   I personally performed the services described in this documentation, which was scribed in my presence. The recorded information has been reviewed and is accurate.      Waynetta Pean, PA-C 03/18/15 [place], MD 03/19/15 [phone]

## 2015-03-18 NOTE — Discharge Instructions (Signed)

## 2015-03-18 NOTE — ED Notes (Signed)
Patient with chronic left knee pain.  She states that the pain increased today after 5pm after she got off from work.  She states that she has had multiple surgeries on that knee.

## 2015-04-02 ENCOUNTER — Emergency Department (HOSPITAL_COMMUNITY)
Admission: EM | Admit: 2015-04-02 | Discharge: 2015-04-02 | Disposition: A | Discharge status: Home / Self Care | Payer: Non-veteran care | Attending: Emergency Medicine | Admitting: Emergency Medicine

## 2015-04-02 ENCOUNTER — Encounter (HOSPITAL_COMMUNITY): Payer: Self-pay | Admitting: Emergency Medicine

## 2015-04-02 DIAGNOSIS — Z8742 Personal history of other diseases of the female genital tract: Secondary | ICD-10-CM | POA: Diagnosis not present

## 2015-04-02 DIAGNOSIS — Z791 Long term (current) use of non-steroidal anti-inflammatories (NSAID): Secondary | ICD-10-CM | POA: Insufficient documentation

## 2015-04-02 DIAGNOSIS — Z79899 Other long term (current) drug therapy: Secondary | ICD-10-CM | POA: Diagnosis not present

## 2015-04-02 DIAGNOSIS — R42 Dizziness and giddiness: Secondary | ICD-10-CM

## 2015-04-02 DIAGNOSIS — Z720 Tobacco use: Secondary | ICD-10-CM | POA: Diagnosis not present

## 2015-04-02 LAB — CBC
HEMATOCRIT: 38.1 % (ref 36.0–46.0)
HEMOGLOBIN: 12.5 g/dL (ref 12.0–15.0)
MCH: 32.1 pg (ref 26.0–34.0)
MCHC: 32.8 g/dL (ref 30.0–36.0)
MCV: 97.9 fL (ref 78.0–100.0)
PLATELETS: 258 10*3/uL (ref 150–400)
RBC: 3.89 MIL/uL (ref 3.87–5.11)
RDW: 11.6 % (ref 11.5–15.5)
WBC: 6.1 10*3/uL (ref 4.0–10.5)

## 2015-04-02 LAB — BASIC METABOLIC PANEL
Anion gap: 4 — ABNORMAL LOW (ref 5–15)
BUN: 14 mg/dL (ref 6–23)
CO2: 25 mmol/L (ref 19–32)
Calcium: 9 mg/dL (ref 8.4–10.5)
Chloride: 108 mmol/L (ref 96–112)
Creatinine, Ser: 0.9 mg/dL (ref 0.50–1.10)
GFR calc Af Amer: >90 mL/min (ref >90)
GFR calc non Af Amer: 84 mL/min — ABNORMAL LOW (ref >90)
Glucose, Bld: 82 mg/dL (ref 70–99)
Potassium: 3.6 mmol/L (ref 3.5–5.1)
SODIUM: 137 mmol/L (ref 135–145)

## 2015-04-02 MED ORDER — MECLIZINE HCL 25 MG PO TABS: 25.0000 mg | ORAL_TABLET | Freq: Three times a day (TID) | ORAL | Status: DC | PRN

## 2015-04-02 MED ORDER — MECLIZINE HCL 25 MG PO TABS
25.0000 mg | ORAL_TABLET | Freq: Once | ORAL | Status: AC
  Administered 2015-04-02: 25 mg via ORAL
  Filled 2015-04-02: qty 1

## 2015-04-02 NOTE — ED Provider Notes (Signed)
CSN: [id number]     Arrival date & time 04/02/15  1915 History   First MD Initiated Contact with Patient 04/02/15 2137     Chief Complaint  Patient presents with  . Dizziness     (Consider location/radiation/quality/duration/timing/severity/associated sxs/prior Treatment) HPI Comments: 34 year old female here with dizziness for the past 2-3 weeks. Intermittent episodes lasting about 10 minutes at a time. No instigating factors. No alleviating or exacerbating factors. Not related to standing up, physical activity, stress.  Patient is a 34 y.o. female presenting with dizziness. The history is provided by the patient.  Dizziness Quality:  Lightheadedness Severity:  Mild Onset quality:  Sudden Duration: 10 minutes. Timing:  Sporadic Progression:  Unchanged Chronicity:  New Context: not with physical activity and not when standing up   Relieved by:  Nothing Worsened by:  Nothing Associated symptoms: no shortness of breath and no vomiting     Past Medical History  Diagnosis Date  . Menorrhagia    Past Surgical History  Procedure Laterality Date  . Knee surgery     History reviewed. No pertinent family history. History  Substance Use Topics  . Smoking status: Current Every Day Smoker  . Smokeless tobacco: Not on file  . Alcohol Use: No   OB History    No data available     Review of Systems  Constitutional: Negative for fever.  Respiratory: Negative for cough and shortness of breath.   Gastrointestinal: Negative for vomiting and abdominal pain.  Neurological: Negative for dizziness.  All other systems reviewed and are negative.     Allergies  Review of patient's allergies indicates no known allergies.  Home Medications   Prior to Admission medications   Medication Sig Start Date End Date Taking? Authorizing Provider  ibuprofen (ADVIL,MOTRIN) 200 MG tablet Take 200 mg by mouth every 6 (six) hours as needed for moderate pain.   Yes Historical Provider, MD   naproxen (NAPROSYN) 500 MG tablet Take 1 tablet (500 mg total) by mouth 2 (two) times daily with a meal. 03/18/15  Yes Waynetta Pean, PA-C  dabigatran (PRADAXA) 150 MG CAPS capsule Take 1 capsule (150 mg total) by mouth every 12 (twelve) hours. Patient not taking: Reported on 04/02/2015 10/21/14   Velvet Bathe, MD  ferrous sulfate 325 (65 FE) MG tablet Take 1 tablet (325 mg total) by mouth 3 (three) times daily with meals. Patient not taking: Reported on 04/02/2015 10/21/14   Velvet Bathe, MD  megestrol (MEGACE) 40 MG tablet Take 3 tablets (120 mg total) by mouth daily. Patient not taking: Reported on 04/02/2015 10/21/14   Velvet Bathe, MD  ondansetron (ZOFRAN ODT) 4 MG disintegrating tablet Take 1 tablet (4 mg total) by mouth every 8 (eight) hours as needed for nausea or vomiting. Patient not taking: Reported on 04/02/2015 04/28/14   Alvina Chou, PA-C  traMADol (ULTRAM) 50 MG tablet Take 1 tablet (50 mg total) by mouth 2 (two) times daily as needed for moderate pain or severe pain. Patient not taking: Reported on 04/02/2015 10/21/14   Velvet Bathe, MD   BP 113/78 mmHg  Pulse 121  Temp(Src) 98 F (36.7 C) (Oral)  Resp 16  SpO2 100%  LMP 03/25/2015 Physical Exam  Constitutional: She is oriented to person, place, and time. She appears well-developed and well-nourished. No distress.  HENT:  Head: Normocephalic and atraumatic.  Mouth/Throat: Oropharynx is clear and moist.  Eyes: EOM are normal. Pupils are equal, round, and reactive to light.  Neck: Normal range of motion. Neck  supple.  Cardiovascular: Normal rate and regular rhythm.  Exam reveals no friction rub.   No murmur heard. Pulmonary/Chest: Effort normal and breath sounds normal. No respiratory distress. She has no wheezes. She has no rales.  Abdominal: Soft. She exhibits no distension. There is no tenderness. There is no rebound.  Musculoskeletal: Normal range of motion. She exhibits no edema.  Neurological: She is alert and  oriented to person, place, and time. No cranial nerve deficit. She exhibits normal muscle tone. Coordination normal.  Skin: She is not diaphoretic.  Nursing note and vitals reviewed.   ED Course  Procedures (including critical care time) Labs Review Labs Reviewed  BASIC METABOLIC PANEL - Abnormal; Notable for the following:    GFR calc non Af Amer 84 (*)    Anion gap 4 (*)    All other components within normal limits  CBC    Imaging Review No results found.   EKG Interpretation None      MDM   Final diagnoses:  Dizziness    53F here with dizziness. Described as lightheadedness, maybe mild slow movement sensation. No vomiting, no diarrhea. No fever. No CP, SOB. Abrupt onset for about 10 minutes, sporadic. No correlation with activity, position change. Labs ok. Given meclizine. Stable for discharge.   Evelina Bucy, MD 04/02/15 2351

## 2015-04-02 NOTE — ED Notes (Signed)
Pt reports x2-3 weeks she has been getting dizzy and lightheaded at random times. Pt reports no pattern and does not  always happen when she goes from a sitting to standing position.

## 2015-04-02 NOTE — Discharge Instructions (Signed)
Dizziness °Dizziness is a common problem. It is a feeling of unsteadiness or light-headedness. You may feel like you are about to faint. Dizziness can lead to injury if you stumble or fall. A person of any age group can suffer from dizziness, but dizziness is more common in older adults. °CAUSES  °Dizziness can be caused by many different things, including: °· Middle ear problems. °· Standing for too long. °· Infections. °· An allergic reaction. °· Aging. °· An emotional response to something, such as the sight of blood. °· Side effects of medicines. °· Tiredness. °· Problems with circulation or blood pressure. °· Excessive use of alcohol or medicines, or illegal drug use. °· Breathing too fast (hyperventilation). °· An irregular heart rhythm (arrhythmia). °· A low red blood cell count (anemia). °· Pregnancy. °· Vomiting, diarrhea, fever, or other illnesses that cause body fluid loss (dehydration). °· Diseases or conditions such as Parkinson's disease, high blood pressure (hypertension), diabetes, and thyroid problems. °· Exposure to extreme heat. °DIAGNOSIS  °Your health care provider will ask about your symptoms, perform a physical exam, and perform an electrocardiogram (ECG) to record the electrical activity of your heart. Your health care provider may also perform other heart or blood tests to determine the cause of your dizziness. These may include: °· Transthoracic echocardiogram (TTE). During echocardiography, sound waves are used to evaluate how blood flows through your heart. °· Transesophageal echocardiogram (TEE). °· Cardiac monitoring. This allows your health care provider to monitor your heart rate and rhythm in real time. °· Holter monitor. This is a portable device that records your heartbeat and can help diagnose heart arrhythmias. It allows your health care provider to track your heart activity for several days if needed. °· Stress tests by exercise or by giving medicine that makes the heart beat  faster. °TREATMENT  °Treatment of dizziness depends on the cause of your symptoms and can vary greatly. °HOME CARE INSTRUCTIONS  °· Drink enough fluids to keep your urine clear or pale yellow. This is especially important in very hot weather. In older adults, it is also important in cold weather. °· Take your medicine exactly as directed if your dizziness is caused by medicines. When taking blood pressure medicines, it is especially important to get up slowly. °¨ Rise slowly from chairs and steady yourself until you feel okay. °¨ In the morning, first sit up on the side of the bed. When you feel okay, stand slowly while holding onto something until you know your balance is fine. °· Move your legs often if you need to stand in one place for a long time. Tighten and relax your muscles in your legs while standing. °· Have someone stay with you for 1-2 days if dizziness continues to be a problem. Do this until you feel you are well enough to stay alone. Have the person call your health care provider if he or she notices changes in you that are concerning. °· Do not drive or use heavy machinery if you feel dizzy. °· Do not drink alcohol. °SEEK IMMEDIATE MEDICAL CARE IF:  °· Your dizziness or light-headedness gets worse. °· You feel nauseous or vomit. °· You have problems talking, walking, or using your arms, hands, or legs. °· You feel weak. °· You are not thinking clearly or you have trouble forming sentences. It may take a friend or family member to notice this. °· You have chest pain, abdominal pain, shortness of breath, or sweating. °· Your vision changes. °· You notice   any bleeding. °· You have side effects from medicine that seems to be getting worse rather than better. °MAKE SURE YOU:  °· Understand these instructions. °· Will watch your condition. °· Will get help right away if you are not doing well or get worse. °Document Released: 05/22/2001 Document Revised: 12/01/2013 Document Reviewed: 06/15/2011 °ExitCare®  Patient Information ©2015 [employer]. This information is not intended to replace advice given to you by your health care provider. Make sure you discuss any questions you have with your health care provider. ° ° °Emergency Department Resource Guide °1) Find a Doctor and Pay Out of Pocket °Although you won't have to find out who is covered by your insurance plan, it is a good idea to ask around and get recommendations. You will then need to call the office and see if the doctor you have chosen will accept you as a new patient and what types of options they offer for patients who are self-pay. Some doctors offer discounts or will set up payment plans for their patients who do not have insurance, but you will need to ask so you aren't surprised when you get to your appointment. ° °2) Contact Your Local Health Department °Not all health departments have doctors that can see patients for sick visits, but many do, so it is worth a call to see if yours does. If you don't know where your local health department is, you can check in your phone book. The CDC also has a tool to help you locate your state's health department, and many state websites also have listings of all of their local health departments. ° °3) Find a Walk-in Clinic °If your illness is not likely to be very severe or complicated, you may want to try a walk in clinic. These are popping up all over the country in pharmacies, drugstores, and shopping centers. They're usually staffed by nurse practitioners or physician assistants that have been trained to treat common illnesses and complaints. They're usually fairly quick and inexpensive. However, if you have serious medical issues or chronic medical problems, these are probably not your best option. ° °No Primary Care Doctor: °- Call [facility] at  832-8000 - they can help you locate a primary care doctor that  accepts your insurance, provides certain services, etc. °- Physician Referral Service-  1-800-533-3463 ° °Chronic Pain Problems: °Organization         Address  Phone   Notes  °[facility]  (336) 297-2271 Patients need to be referred by their primary care doctor.  ° °Medication Assistance: °Organization         Address  Phone   Notes  °[place] [facility] [address] °[place], [place] [address] (336) 641-8030 --Must be a resident of [place] °-- Must have NO insurance coverage whatsoever (no Medicaid/ Medicare, etc.) °-- The pt. MUST have a primary care doctor that directs their care regularly and follows them in the community °  °[facility]  (866) 331-1348   °[employer]  (888) 892-1162   ° °Agencies that provide inexpensive medical care: °Organization         Address  Phone   Notes  °[facility]  (336) 832-8035   °[facility]    (336) 832-7272   °[facility] [address] °[place], [place] [address] (336) 832-4777   °[facility] [address], ° (336) 271-4999   °[facility]    (  336) 373-0678   °[facility]    (336) 272-1050   °[facility] ° [address], [place] Phone:  (336) 832-4444, Fax:  (336) 832-4440 Hours of Operation:  9 am - 6 pm, M-F.  Also accepts Medicaid/Medicare and self-pay.  °[facility] ° [address], [place] Phone: (336) 832-3150, Fax: (336) 832-3151. Hours of Operation:  8:30 am - 5:30 pm, M-F.  Also accepts Medicaid and self-pay.  °[facility] [place] [address], [place] Phone: (336) 878-6027   °[facility] [address], [place], [place] (336)723-1848, Ext. 123 Mondays & Thursdays: 7-9 AM.  First 15 patients are seen on a first come, first serve basis. °  ° °Medicaid-accepting [place] Providers: ° °Organization         Address  Phone   Notes  °[facility] [address],  [place] (336) 641-2100 Also accepts self-pay patients.  °[facility] [address], [place] ° (336) 856-9996   °[facility] [address], [place] (336) 288-8857   °[facility] [address] [place] [address], [place] (336) 299-7000   °Veita Bland [address], [place]  ° (336) 373-1557 Only accepts [place] Access Medicaid patients after they have their name applied to their card.  ° °Self-Pay (no insurance) in [place]: ° °Organization         Address  Phone   Notes  °Sickle Cell Patients, [facility] [address], [place] (336) 832-1970   °[facility] [address], [place] (336) 832-4400   °[facility] ° [address], [place] (336) 992-4800   °[facility]/Dr. Osei-Bonsu ° [address] [place] [address], [place] or [address], [place] (336) [phone] Phone number for both [place] and [place] locations is the same.  °[facility] [address], [place] (336) 299-0000   °[facility] [address] [place] [address], [place] or [address] (336) 852-7530 °(336) 878-2260   °[facility] [address], [place] (336) 350-1642, phone; (336) 294-5005, fax Sees patients 1st and 3rd Saturday of every month.  Must not qualify for public or private insurance (i.e. Medicaid, Medicare, [place] Health Choice, Veterans' Benefits) • Household income should be no more than 200% of the poverty level •The clinic cannot treat you if you are pregnant or think you are pregnant • Sexually transmitted diseases are not treated at the clinic.  ° ° °Dental Care: °Organization         Address  Phone  Notes  °[facility] [address],  (336) 641-6152 Accepts children up to age 21 who are enrolled in  Medicaid or [place] Health Choice; pregnant women with a Medicaid card; and children who have applied for Medicaid or [place] Health Choice, but were declined, whose parents can pay a reduced fee at time of service.  °[facility] [place]  [address], [place] (336) 641-7733 Accepts children up to age 21 who are enrolled in Medicaid or [place] Health Choice; pregnant women with a Medicaid card; and children who have applied for Medicaid or [place] Health Choice, but were declined, whose parents can pay a   reduced fee at time of service.  °[facility] ° [address], [place] (336) 641-4533 Patients are seen by appointment only. Walk-ins are not accepted. [facility] will see patients 18 years of age and older. °Monday - Tuesday (8am-5pm) °Most Wednesdays (8:30-5pm) °$30 per visit, cash only  °[facility] ° [address], [place] (336) 641-4533 Patients are seen by appointment only. Walk-ins are not accepted. [facility] will see patients 18 years of age and older. °One Wednesday Evening (Monthly: Volunteer Based).  $30 per visit, cash only  °[facility]  (919) 537-3737 for adults; Children under age 4, call Graduate Pediatric Dentistry at ([phone]) [phone]. Children aged 4-14, please call (919) [phone] to request a pediatric application. ° Dental services are provided in all areas of dental care including fillings, crowns and bridges, complete and partial dentures, implants, gum treatment, root canals, and extractions. Preventive care is also provided. Treatment is provided to both adults and children. °Patients are selected via a lottery and there is often a waiting list. °  °[facility] [address], °[place] ° (336) 763-8833 [email or web address] °  °[facility] [address], [place], [place] (336)723-1848, Ext. 123 Second and Fourth Thursday of each month, opens at 6:30  AM; Clinic ends at 9 AM.  Patients are seen on a first-come first-served basis, and a limited number are seen during each clinic.  ° °[facility] ° [address], [place], [place] (336) 723-7904   Eligibility Requirements °You must have lived in [place], [place], or [place] counties for at least the last three months. °  You cannot be eligible for state or federal sponsored healthcare insurance, including Veterans Administration, Medicaid, or Medicare. °  You generally cannot be eligible for healthcare insurance through your employer.  °  How to apply: °Eligibility screenings are held every Tuesday and Wednesday afternoon from 1:00 pm until 4:00 pm. You do not need an appointment for the interview!  °[facility] [address], [place], [place] 336-631-2330   °[facility]  336-342-8273   °[facility]  336-703-3100   °[facility]  336-570-6415   ° °Behavioral Health Resources in the Community: °Intensive Outpatient Programs °Organization         Address  Phone  Notes  °[facility] [address], [place], [place] 336-878-6098   °[facility] [address], [place], [place] 336-832-9800   °[facility] [address], [place], [place] ° 336-882-2125   °[facility] [address],  °[place], [place] 1-800-853-5163 or 336-641-4981   °Substance Abuse Resources °Organization         Address  Phone  Notes  °[facility]  336-882-2125   °[facility]  336-784-9470   °[facility]  336-285-9073   °[facility]  336-845-3988   °Residential & Outpatient Substance Abuse Program  1-800-659-3381   °Psychological Services °Organization         Address  Phone  Notes  ° Health  336- 832-9600   °[facility]  336- 378-7881   °[facility] [address],  1-800-853-5163 or  336-641-4981   ° °Mobile Crisis Teams °Organization         Address  Phone  Notes  °[facility], Mobile Crisis Care Unit  1-877-626-1772   °  Assertive °[facility] ° [address] [place], [place] 336-834-9664   °Sharon DeEsch [address] °[place] 336-554-5454   ° °Self-Help/Support Groups °Organization         Address  Phone             Notes  °[facility] - variety of support groups  336- 373-1402 Call for more information  °Narcotics Anonymous (NA), [facility] [address], °[place]  2 meetings at this location  ° °Residential Treatment Programs °Organization         Address  Phone  Notes  °[facility] [address],    °[place]  1-866-801-8205   °[facility] ° [address], [place], [place] 704-293-8524   °[facility] [address], [place] 336-845-3988 Admissions: 8am-3pm M-F  °[facility] [address]    °[place], [place] 336-841-1104   °[facility] [address], [place], [place] 336-379-7146   °[facility] [address]  °[place], [place] 336-285-9073   °[facility] - Intensive Outpatient [address], [place], [place] 336-852-3033   °[facility] [address]  °[place], [place] 1-877-615-2722 or 336-784-9470   °[facility] [address] [place], [place] 336-227-7417 Accepts Medicaid  °[facility] [address]  °[place] 1-800-659-3381 Substance Abuse/Addiction Treatment  ° °[place] Behavioral Health Resources °Organization         Address  Phone  Notes  °[facility]  (888) 581-9988   °Julie Brannon, PhD [address] [place], [place]   (336) 349-5553 or (336) 951-0000   °[facility]   [address] °[place], [place] (336) 349-4454   °[facility] [address],  [place], [place] (336) 342-8316 Insurance/Medicaid/sponsorship through [facility]  °[facility] [address] [place]                                    [place], [place] (336) 342-8316 Therapy/tele-psych/case  °[facility] [address]  ° [place], [place] (336) 349-2233    °Dr. Arfeen  (336) 349-4544   °[facility] [place]  [employer] [place] [facility] 1) [address], [place] °2) [address], [place] °3)  [address], [place] (336) 349-3220 °(336) 342-7768 ° °(336) 342-8140   °[place] [facility] (336) 342-1394 or (336) 342-3537 (After Hours)    ° °  °

## 2015-04-08 ENCOUNTER — Ambulatory Visit (INDEPENDENT_AMBULATORY_CARE_PROVIDER_SITE_OTHER): Payer: 59 | Admitting: Physician Assistant

## 2015-04-08 VITALS — BP 110/66 | HR 67 | Temp 98.2°F | Resp 16 | Ht 62.0 in | Wt 151.2 lb

## 2015-04-08 DIAGNOSIS — M791 Myalgia, unspecified site: Secondary | ICD-10-CM

## 2015-04-08 DIAGNOSIS — J069 Acute upper respiratory infection, unspecified: Secondary | ICD-10-CM | POA: Diagnosis not present

## 2015-04-08 LAB — POCT INFLUENZA A/B
Influenza A, POC: NEGATIVE
Influenza B, POC: NEGATIVE

## 2015-04-08 MED ORDER — GUAIFENESIN ER 1200 MG PO TB12: 1.0000 | ORAL_TABLET | Freq: Two times a day (BID) | ORAL | Status: DC | PRN

## 2015-04-08 MED ORDER — IPRATROPIUM BROMIDE 0.03 % NA SOLN: 2.0000 | Freq: Two times a day (BID) | NASAL | Status: DC

## 2015-04-08 NOTE — Patient Instructions (Signed)
Upper Respiratory Infection, Adult An upper respiratory infection (URI) is also sometimes known as the common cold. The upper respiratory tract includes the nose, sinuses, throat, trachea, and bronchi. Bronchi are the airways leading to the lungs. Most people improve within 1 week, but symptoms can last up to 2 weeks. A residual cough may last even longer.  CAUSES Many different viruses can infect the tissues lining the upper respiratory tract. The tissues become irritated and inflamed and often become very moist. Mucus production is also common. A cold is contagious. You can easily spread the virus to others by oral contact. This includes kissing, sharing a glass, coughing, or sneezing. Touching your mouth or nose and then touching a surface, which is then touched by another person, can also spread the virus. SYMPTOMS  Symptoms typically develop 1 to 3 days after you come in contact with a cold virus. Symptoms vary from person to person. They may include:  Runny nose.  Sneezing.  Nasal congestion.  Sinus irritation.  Sore throat.  Loss of voice (laryngitis).  Cough.  Fatigue.  Muscle aches.  Loss of appetite.  Headache.  Low-grade fever. DIAGNOSIS  You might diagnose your own cold based on familiar symptoms, since most people get a cold 2 to 3 times a year. Your caregiver can confirm this based on your exam. Most importantly, your caregiver can check that your symptoms are not due to another disease such as strep throat, sinusitis, pneumonia, asthma, or epiglottitis. Blood tests, throat tests, and X-rays are not necessary to diagnose a common cold, but they may sometimes be helpful in excluding other more serious diseases. Your caregiver will decide if any further tests are required. RISKS AND COMPLICATIONS  You may be at risk for a more severe case of the common cold if you smoke cigarettes, have chronic heart disease (such as heart failure) or lung disease (such as asthma), or if  you have a weakened immune system. The very young and very old are also at risk for more serious infections. Bacterial sinusitis, middle ear infections, and bacterial pneumonia can complicate the common cold. The common cold can worsen asthma and chronic obstructive pulmonary disease (COPD). Sometimes, these complications can require emergency medical care and may be life-threatening. PREVENTION  The best way to protect against getting a cold is to practice good hygiene. Avoid oral or hand contact with people with cold symptoms. Wash your hands often if contact occurs. There is no clear evidence that vitamin C, vitamin E, echinacea, or exercise reduces the chance of developing a cold. However, it is always recommended to get plenty of rest and practice good nutrition. TREATMENT  Treatment is directed at relieving symptoms. There is no cure. Antibiotics are not effective, because the infection is caused by a virus, not by bacteria. Treatment may include:  Increased fluid intake. Sports drinks offer valuable electrolytes, sugars, and fluids.  Breathing heated mist or steam (vaporizer or shower).  Eating chicken soup or other clear broths, and maintaining good nutrition.  Getting plenty of rest.  Using gargles or lozenges for comfort.  Controlling fevers with ibuprofen or acetaminophen as directed by your caregiver.  Increasing usage of your inhaler if you have asthma. Zinc gel and zinc lozenges, taken in the first 24 hours of the common cold, can shorten the duration and lessen the severity of symptoms. Pain medicines may help with fever, muscle aches, and throat pain. A variety of non-prescription medicines are available to treat congestion and runny nose. Your caregiver   can make recommendations and may suggest nasal or lung inhalers for other symptoms.  HOME CARE INSTRUCTIONS   Only take over-the-counter or prescription medicines for pain, discomfort, or fever as directed by your  caregiver.  Use a warm mist humidifier or inhale steam from a shower to increase air moisture. This may keep secretions moist and make it easier to breathe.  Drink enough water and fluids to keep your urine clear or pale yellow.  Rest as needed.  Return to work when your temperature has returned to normal or as your caregiver advises. You may need to stay home longer to avoid infecting others. You can also use a face mask and careful hand washing to prevent spread of the virus. SEEK MEDICAL CARE IF:   After the first few days, you feel you are getting worse rather than better.  You need your caregiver's advice about medicines to control symptoms.  You develop chills, worsening shortness of breath, or brown or red sputum. These may be signs of pneumonia.  You develop yellow or brown nasal discharge or pain in the face, especially when you bend forward. These may be signs of sinusitis.  You develop a fever, swollen neck glands, pain with swallowing, or white areas in the back of your throat. These may be signs of strep throat. SEEK IMMEDIATE MEDICAL CARE IF:   You have a fever.  You develop severe or persistent headache, ear pain, sinus pain, or chest pain.  You develop wheezing, a prolonged cough, cough up blood, or have a change in your usual mucus (if you have chronic lung disease).  You develop sore muscles or a stiff neck. Document Released: 05/22/2001 Document Revised: 02/18/2012 Document Reviewed: 03/03/2014 [employer] Patient Information 2015 [employer]. This information is not intended to replace advice given to you by your health care provider. Make sure you discuss any questions you have with your health care provider.  

## 2015-04-08 NOTE — Progress Notes (Signed)
Subjective:    Patient ID: Janice Duran, female    DOB: 04/16/1981, 34 y.o.   MRN: [id number]  HPI Patient presents for myalgias and chills that have been present for 3 days. Additionally endorses feeling hot, having ear pain, sore throat, some cough, rhinorrhea, congestion, nausea, and HA. Denies sinus pressure, SOB/CP, and vomiting. Has tried Day and NightQuil without relief. No known sick contacts, but works with the public. No h/o asthma or allergies. Smokes 1/2 pack daily. NKDA.    Review of Systems  Constitutional: Positive for fever, chills and fatigue. Negative for activity change and appetite change.  HENT: Positive for congestion, ear pain, rhinorrhea, sneezing and sore throat. Negative for ear discharge, postnasal drip, sinus pressure, tinnitus and trouble swallowing.   Eyes: Negative.   Respiratory: Positive for cough. Negative for shortness of breath and wheezing.   Cardiovascular: Negative for chest pain.  Gastrointestinal: Positive for nausea. Negative for vomiting and abdominal pain.  Musculoskeletal: Positive for myalgias. Negative for neck pain and neck stiffness.  Allergic/Immunologic: Negative for environmental allergies.  Neurological: Positive for headaches. Negative for dizziness.       Objective:   Physical Exam  Constitutional: She is oriented to person, place, and time. She appears well-developed and well-nourished. No distress.  Blood pressure 110/66, pulse 67, temperature 98.2 F (36.8 C), temperature source Oral, resp. rate 16, height 5\' 2"  (1.575 m), weight 151 lb 3.2 oz (68.584 kg), last menstrual period 03/25/2015, SpO2 98 %.  HENT:  Head: Normocephalic and atraumatic.  Right Ear: Ear canal normal. There is tenderness.  Left Ear: Ear canal normal. There is tenderness. A middle ear effusion (copious amount of cerumen, non-impacted.) is present.  Nose: Rhinorrhea (with mild erythema) present. No mucosal edema, nose lacerations or sinus tenderness.  Right sinus exhibits no maxillary sinus tenderness and no frontal sinus tenderness. Left sinus exhibits no maxillary sinus tenderness and no frontal sinus tenderness.  Mouth/Throat: Uvula is midline, oropharynx is clear and moist and mucous membranes are normal. No oropharyngeal exudate, posterior oropharyngeal edema or posterior oropharyngeal erythema.  Eyes: Conjunctivae are normal. Right eye exhibits no discharge. Left eye exhibits no discharge. No scleral icterus.  Neck: Normal range of motion. Neck supple. No thyromegaly present.  Cardiovascular: Normal rate, regular rhythm and normal heart sounds.  Exam reveals no gallop and no friction rub.   No murmur heard. Pulmonary/Chest: Effort normal and breath sounds normal. No respiratory distress. She has no decreased breath sounds. She has no wheezes. She has no rhonchi. She has no rales.  Abdominal: Soft. Bowel sounds are normal. She exhibits no distension. There is no tenderness. There is no guarding.  Lymphadenopathy:    She has no cervical adenopathy.  Neurological: She is alert and oriented to person, place, and time.  Skin: Skin is warm and dry. No rash noted. She is not diaphoretic. No erythema. No pallor.   Results for orders placed or performed in visit on 04/08/15  POCT Influenza A/B  Result Value Ref Range   Influenza A, POC Negative    Influenza B, POC Negative       Assessment & Plan:  1. Myalgia 2. Acute upper respiratory infection Plenty of fluid and rest. - POCT Influenza A/B - ipratropium (ATROVENT) 0.03 % nasal spray; Place 2 sprays into both nostrils 2 (two) times daily.  Dispense: 30 mL; Refill: 0 - Guaifenesin (MUCINEX MAXIMUM STRENGTH) 1200 MG TB12; Take 1 tablet (1,200 mg total) by mouth every 12 (twelve) hours as  needed.  Dispense: 14 tablet; Refill: [place] PA-C  [facility] 04/08/2015 2:58 PM

## 2015-05-09 ENCOUNTER — Emergency Department (HOSPITAL_COMMUNITY)
Admission: EM | Admit: 2015-05-09 | Discharge: 2015-05-09 | Disposition: A | Discharge status: Home / Self Care | Payer: Non-veteran care | Attending: Emergency Medicine | Admitting: Emergency Medicine

## 2015-05-09 ENCOUNTER — Encounter (HOSPITAL_COMMUNITY): Payer: Self-pay | Admitting: Emergency Medicine

## 2015-05-09 DIAGNOSIS — Y9389 Activity, other specified: Secondary | ICD-10-CM | POA: Diagnosis not present

## 2015-05-09 DIAGNOSIS — X58XXXA Exposure to other specified factors, initial encounter: Secondary | ICD-10-CM | POA: Diagnosis not present

## 2015-05-09 DIAGNOSIS — Z791 Long term (current) use of non-steroidal anti-inflammatories (NSAID): Secondary | ICD-10-CM | POA: Diagnosis not present

## 2015-05-09 DIAGNOSIS — S3992XA Unspecified injury of lower back, initial encounter: Secondary | ICD-10-CM | POA: Insufficient documentation

## 2015-05-09 DIAGNOSIS — Y998 Other external cause status: Secondary | ICD-10-CM | POA: Insufficient documentation

## 2015-05-09 DIAGNOSIS — Y9289 Other specified places as the place of occurrence of the external cause: Secondary | ICD-10-CM | POA: Diagnosis not present

## 2015-05-09 DIAGNOSIS — M545 Low back pain, unspecified: Secondary | ICD-10-CM

## 2015-05-09 DIAGNOSIS — Z720 Tobacco use: Secondary | ICD-10-CM | POA: Diagnosis not present

## 2015-05-09 DIAGNOSIS — Z79899 Other long term (current) drug therapy: Secondary | ICD-10-CM | POA: Insufficient documentation

## 2015-05-09 DIAGNOSIS — G8929 Other chronic pain: Secondary | ICD-10-CM | POA: Diagnosis not present

## 2015-05-09 DIAGNOSIS — D649 Anemia, unspecified: Secondary | ICD-10-CM | POA: Insufficient documentation

## 2015-05-09 DIAGNOSIS — Z8742 Personal history of other diseases of the female genital tract: Secondary | ICD-10-CM | POA: Diagnosis not present

## 2015-05-09 DIAGNOSIS — Z8739 Personal history of other diseases of the musculoskeletal system and connective tissue: Secondary | ICD-10-CM | POA: Insufficient documentation

## 2015-05-09 MED ORDER — CYCLOBENZAPRINE HCL 10 MG PO TABS: 10.0000 mg | ORAL_TABLET | Freq: Two times a day (BID) | ORAL | Status: DC | PRN

## 2015-05-09 MED ORDER — KETOROLAC TROMETHAMINE 60 MG/2ML IM SOLN
60.0000 mg | Freq: Once | INTRAMUSCULAR | Status: AC
  Administered 2015-05-09: 60 mg via INTRAMUSCULAR
  Filled 2015-05-09: qty 2

## 2015-05-09 NOTE — ED Notes (Signed)
Pt c/o sudden onset L lower back pain after lifting a headboard last night. Pt sts "I popped something." Pt has hx of back muscle problems, sts this pain is different. Pt A&Ox4 and ambulatory. Denies numbness or tingling in arms and legs.

## 2015-05-09 NOTE — ED Provider Notes (Signed)
CSN: [id number]     Arrival date & time 05/09/15  1428 History  This chart was scribed for non-physician practitioner Doyce Loose, PA-C, working with Malvin Johns, MD, by Thea Alken, ED Scribe. This patient was seen in room WTR8/WTR8 and the patient's care was started at 4:33 PM.    Chief Complaint  Patient presents with  . Back Pain   The history is provided by the patient. No language interpreter was used.   Janice Duran is a 34 y.o. female who presents to the Emergency Department complaining of left lower back pain that began last night. Pt states she was trying to lift something last night when she heard a "pop" in her back. She now has localized, sharp, left back pain worse when sitting on left side. She took tramadol last night which she usually takes for chronic knee pain without significant improvement.  She denies fever, chills, numbness, tingling, weakness, abdominal pain, and dysuria. Pt stopped taking pradaxa 9 months ago which she was initially taking for blood clot in left lower.    Past Medical History  Diagnosis Date  . Menorrhagia   . Anemia   . Arthritis   . Blood transfusion without reported diagnosis    Past Surgical History  Procedure Laterality Date  . Knee surgery    . Knee arthroscopy w/ acl reconstruction Left   . Meniscus repair Left   . Robotic assisted laparoscopic vaginal hysterectomy with fibroid removal Left    No family history on file. History  Substance Use Topics  . Smoking status: Current Every Day Smoker -- 0.50 packs/day  . Smokeless tobacco: Not on file  . Alcohol Use: No   OB History    No data available     Review of Systems  Constitutional: Negative for fever and chills.  Gastrointestinal: Negative for nausea, vomiting and abdominal pain.  Genitourinary: Negative for dysuria.  Musculoskeletal: Positive for myalgias and back pain.  Neurological: Negative for weakness and numbness.   Allergies  Review of patient's allergies  indicates no known allergies.  Home Medications   Prior to Admission medications   Medication Sig Start Date End Date Taking? Authorizing Provider  ibuprofen (ADVIL,MOTRIN) 200 MG tablet Take 400 mg by mouth every 6 (six) hours as needed for moderate pain (pain).    Yes Historical Provider, MD  traMADol (ULTRAM) 50 MG tablet Take 1 tablet (50 mg total) by mouth 2 (two) times daily as needed for moderate pain or severe pain. 10/21/14  Yes Velvet Bathe, MD  cyclobenzaprine (FLEXERIL) 10 MG tablet Take 1 tablet (10 mg total) by mouth 2 (two) times daily as needed for muscle spasms. 05/09/15   Al Corpus, PA-C  dabigatran (PRADAXA) 150 MG CAPS capsule Take 1 capsule (150 mg total) by mouth every 12 (twelve) hours. Patient not taking: Reported on 04/02/2015 10/21/14   Velvet Bathe, MD  ferrous sulfate 325 (65 FE) MG tablet Take 1 tablet (325 mg total) by mouth 3 (three) times daily with meals. Patient not taking: Reported on 04/02/2015 10/21/14   Velvet Bathe, MD  Guaifenesin [facility] MAXIMUM STRENGTH) 1200 MG TB12 Take 1 tablet (1,200 mg total) by mouth every 12 (twelve) hours as needed. Patient not taking: Reported on 05/09/2015 04/08/15   Tishira R Brewington, PA-C  ipratropium (ATROVENT) 0.03 % nasal spray Place 2 sprays into both nostrils 2 (two) times daily. Patient not taking: Reported on 05/09/2015 04/08/15   Tishira R Brewington, PA-C  meclizine (ANTIVERT) 25 MG tablet Take  1 tablet (25 mg total) by mouth 3 (three) times daily as needed for dizziness. Patient not taking: Reported on 04/08/2015 04/02/15   Evelina Bucy, MD  megestrol (MEGACE) 40 MG tablet Take 3 tablets (120 mg total) by mouth daily. Patient not taking: Reported on 04/02/2015 10/21/14   Velvet Bathe, MD  naproxen (NAPROSYN) 500 MG tablet Take 1 tablet (500 mg total) by mouth 2 (two) times daily with a meal. 03/18/15   Waynetta Pean, PA-C  ondansetron (ZOFRAN ODT) 4 MG disintegrating tablet Take 1 tablet (4 mg total) by mouth every  8 (eight) hours as needed for nausea or vomiting. Patient not taking: Reported on 04/02/2015 04/28/14   Alvina Chou, PA-C   BP 116/80 mmHg  Pulse 84  Temp(Src) 97.7 F (36.5 C) (Oral)  Resp 16  SpO2 100%  LMP 05/02/2015 Physical Exam  Constitutional: She appears well-developed and well-nourished. No distress.  HENT:  Head: Normocephalic and atraumatic.  Eyes: Conjunctivae are normal. Right eye exhibits no discharge. Left eye exhibits no discharge.  Cardiovascular: Normal rate, regular rhythm and normal heart sounds.   Pulmonary/Chest: Effort normal and breath sounds normal. No respiratory distress. She has no wheezes.  Abdominal: Soft. Bowel sounds are normal. She exhibits no distension. There is no tenderness.  Musculoskeletal:  Muscles tightness to left lower lateral back.  No midline back tenderness, step off or crepitus.   Neurological: She is alert. Coordination normal.  Equal muscle tone. 5/5 strength in lower extremities. DTR equal and intact. Negative straight leg test. Antalgic gait.   Skin: Skin is warm and dry. She is not diaphoretic.  Psychiatric: She has a normal mood and affect. Her behavior is normal.  Nursing note and vitals reviewed.   ED Course  Procedures (including critical care time) DIAGNOSTIC STUDIES: Oxygen Saturation is 100% on RA, normal by my interpretation.    COORDINATION OF CARE: 5:06 PM- Pt advised of plan for treatment which includes an injection of Toradol  and pt agrees.  Labs Review Labs Reviewed - No data to display  Imaging Review No results found.   EKG Interpretation None      MDM   Final diagnoses:  Left-sided low back pain without sciatica   Patient with back pain after lifting. No loss of bowel or bladder control. No saddle anesthesia. No fever, night sweats, weight loss, h/o cancer, IVDU. VSS. No neurological deficits and normal neuro exam. Patient ambulatory. No concern for cauda equina.  RICE protocol and muscle  relaxor indicated and discussed with patient. Driving and sedation precautions provided. Patient is afebrile, nontoxic, and in no acute distress. Patient is appropriate for outpatient management and is stable for discharge.  Discussed return precautions with patient. Discussed all results and patient verbalizes understanding and agrees with plan.  I personally performed the services described in this documentation, which was scribed in my presence. The recorded information has been reviewed and is accurate.     Al Corpus, PA-C 05/09/15 [place], MD 05/09/15 [phone]

## 2015-05-09 NOTE — Discharge Instructions (Signed)
Return to the emergency room with worsening of symptoms, new symptoms or with symptoms that are concerning , especially fevers, loss of control of bladder or bowels, numbness or tingling around genital region or anus, weakness. RICE: Rest, Ice (three cycles of 20 mins on, 85mns off at least twice a day), compression/brace, elevation. Heating pad works well for back pain. Ibuprofen 4077m(2 tablets 20023mevery 5-6 hours for 3-5 days. Flexeril for severe pain. Do not operate machinery, drive or drink alcohol while taking narcotics or muscle relaxers. Follow up with PCP/orthopedist if symptoms worsen or are persistent.   Back Injury Prevention Back injuries can be extremely painful and difficult to heal. After having one back injury, you are much more likely to experience another later on. It is important to learn how to avoid injuring or re-injuring your back. The following tips can help you to prevent a back injury. PHYSICAL FITNESS  Exercise regularly and try to develop good tone in your abdominal muscles. Your abdominal muscles provide a lot of the support needed by your back.  Do aerobic exercises (walking, jogging, biking, swimming) regularly.  Do exercises that increase balance and strength (tai chi, yoga) regularly. This can decrease your risk of falling and injuring your back.  Stretch before and after exercising.  Maintain a healthy weight. The more you weigh, the more stress is placed on your back. For every pound of weight, 10 times that amount of pressure is placed on the back. DIET  Talk to your caregiver about how much calcium and vitamin D you need per day. These nutrients help to prevent weakening of the bones (osteoporosis). Osteoporosis can cause broken (fractured) bones that lead to back pain.  Include good sources of calcium in your diet, such as dairy products, green, leafy vegetables, and products with calcium added (fortified).  Include good sources of vitamin D in  your diet, such as milk and foods that are fortified with vitamin D.  Consider taking a nutritional supplement or a multivitamin if needed.  Stop smoking if you smoke. POSTURE  Sit and stand up straight. Avoid leaning forward when you sit or hunching over when you stand.  Choose chairs with good low back (lumbar) support.  If you work at a desk, sit close to your work so you do not need to lean over. Keep your chin tucked in. Keep your neck drawn back and elbows bent at a right angle. Your arms should look like the letter "L."  Sit high and close to the steering wheel when you drive. Add a lumbar support to your car seat if needed.  Avoid sitting or standing in one position for too long. Take breaks to get up, stretch, and walk around at least once every hour. Take breaks if you are driving for long periods of time.  Sleep on your side with your knees slightly bent, or sleep on your back with a pillow under your knees. Do not sleep on your stomach. LIFTING, TWISTING, AND REACHING  Avoid heavy lifting, especially repetitive lifting. If you must do heavy lifting:  Stretch before lifting.  Work slowly.  Rest between lifts.  Use carts and dollies to move objects when possible.  Make several small trips instead of carrying 1 heavy load.  Ask for help when you need it.  Ask for help when moving big, awkward objects.  Follow these steps when lifting:  Stand with your feet shoulder-width apart.  Get as close to the object as you can. Do  not try to pick up heavy objects that are far from your body.  Use handles or lifting straps if they are available.  Bend at your knees. Squat down, but keep your heels off the floor.  Keep your shoulders pulled back, your chin tucked in, and your back straight.  Lift the object slowly, tightening the muscles in your legs, abdomen, and buttocks. Keep the object as close to the center of your body as possible.  When you put a load down, use  these same guidelines in reverse.  Do not:  Lift the object above your waist.  Twist at the waist while lifting or carrying a load. Move your feet if you need to turn, not your waist.  Bend over without bending at your knees.  Avoid reaching over your head, across a table, or for an object on a high surface. OTHER TIPS  Avoid wet floors and keep sidewalks clear of ice to prevent falls.  Do not sleep on a mattress that is too soft or too hard.  Keep items that are used frequently within easy reach.  Put heavier objects on shelves at waist level and lighter objects on lower or higher shelves.  Find ways to decrease your stress, such as exercise, massage, or relaxation techniques. Stress can build up in your muscles. Tense muscles are more vulnerable to injury.  Seek treatment for depression or anxiety if needed. These conditions can increase your risk of developing back pain. SEEK MEDICAL CARE IF:  You injure your back.  You have questions about diet, exercise, or other ways to prevent back injuries. MAKE SURE YOU:  Understand these instructions.  Will watch your condition.  Will get help right away if you are not doing well or get worse. Document Released: 01/03/2005 Document Revised: 02/18/2012 Document Reviewed: 01/07/2012 [facility] Patient Information 2015 [place], [place]. This information is not intended to replace advice given to you by your health care provider. Make sure you discuss any questions you have with your health care provider.

## 2015-05-25 IMAGING — US US PELVIS COMPLETE
1 series · 13 of 25 positions shown · non-contrast
Comparison: None

CLINICAL DATA: Four days of heavy bleeding.



[Series 1: us pelvis complete · 64 acquisitions, 13 frames shown]
[im 1/64]
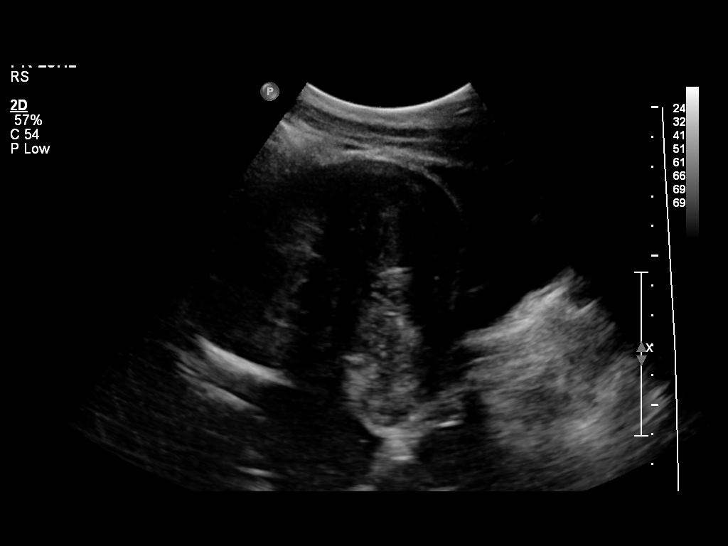
[im 6/64]
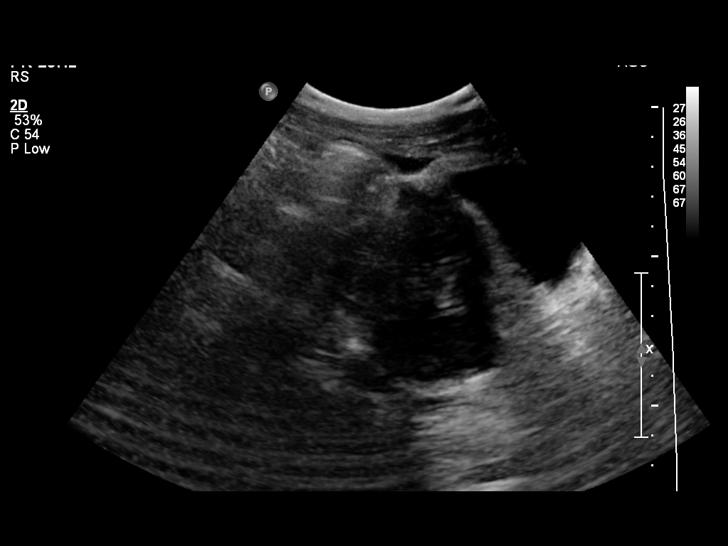
[im 11/64]
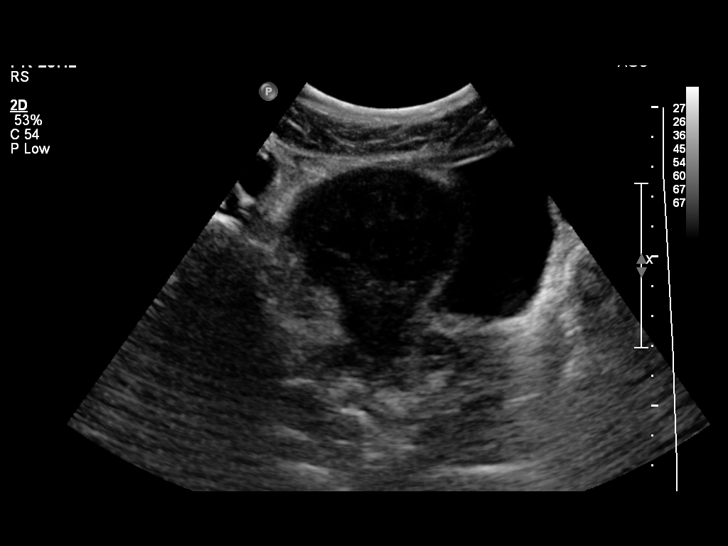
[im 16/64]
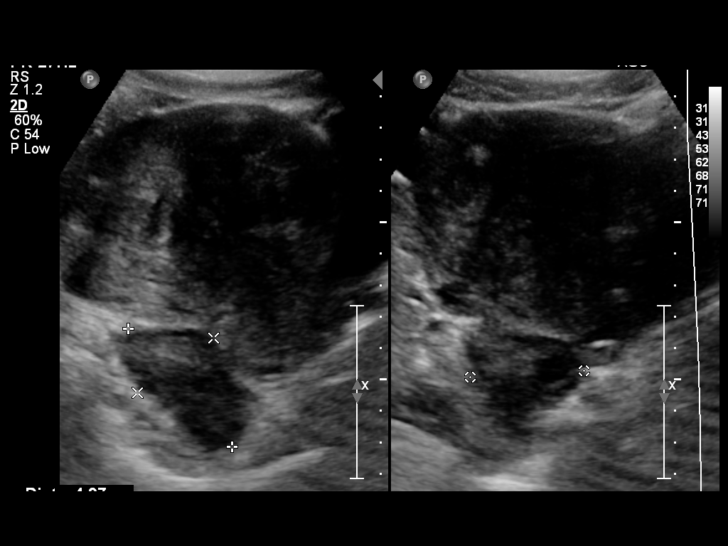
[im 22/64]
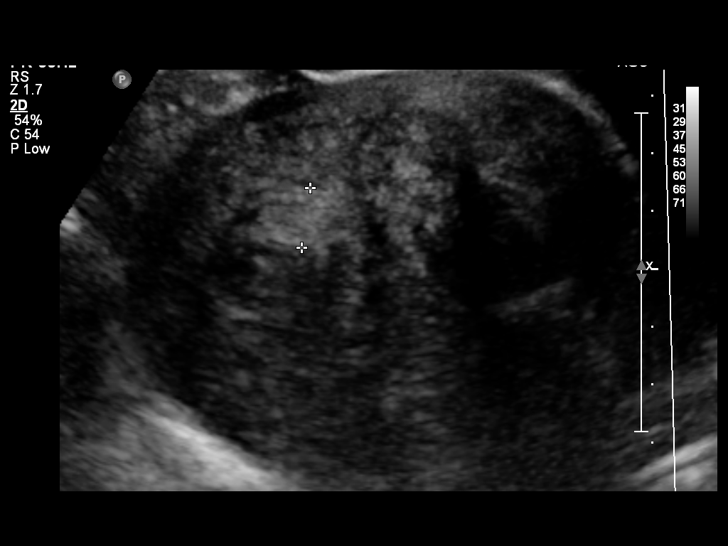
[im 27/64]
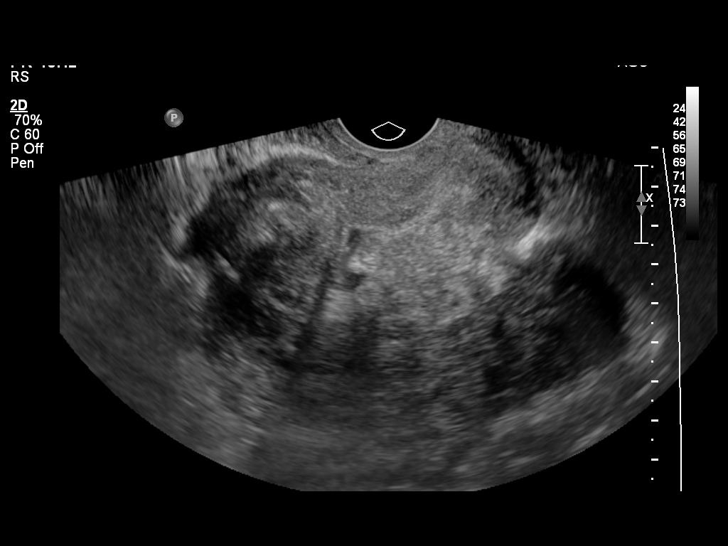
[im 32/64]
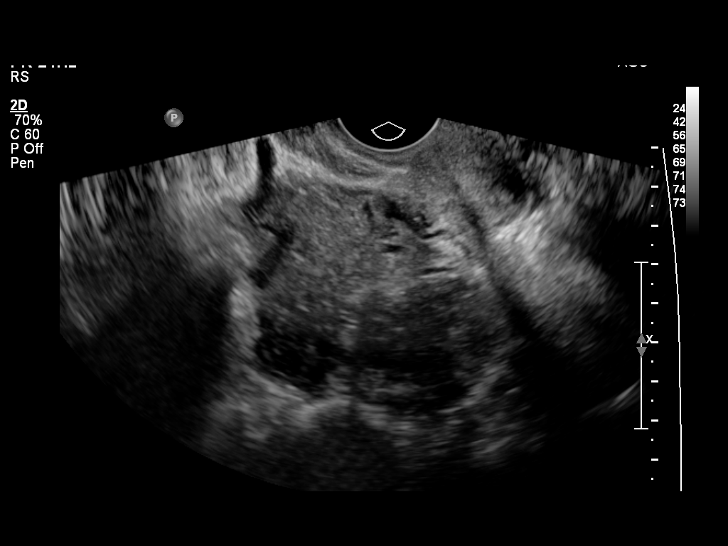
[im 37/64]
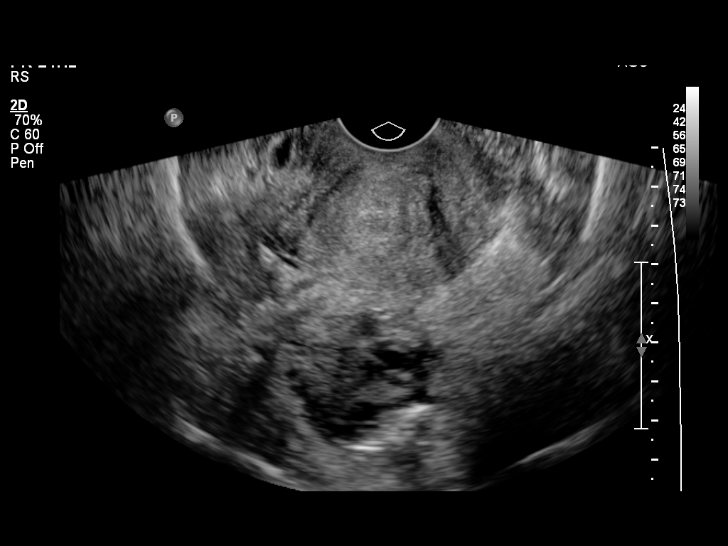
[im 43/64]
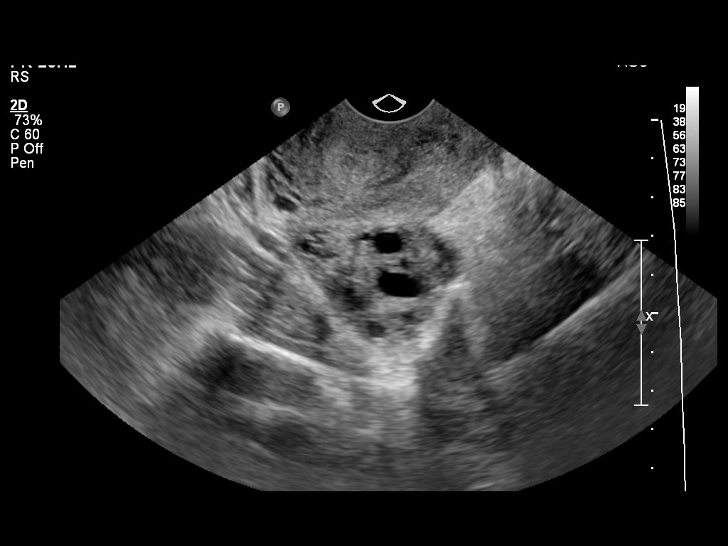
[im 48/64]
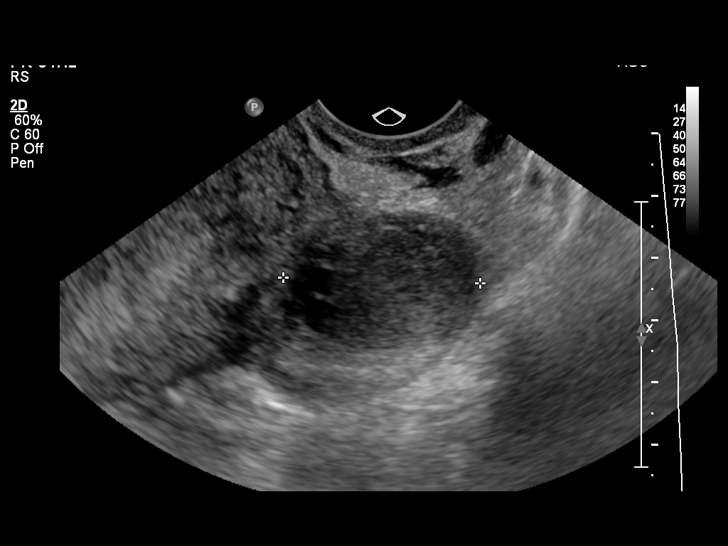
[im 53/64]
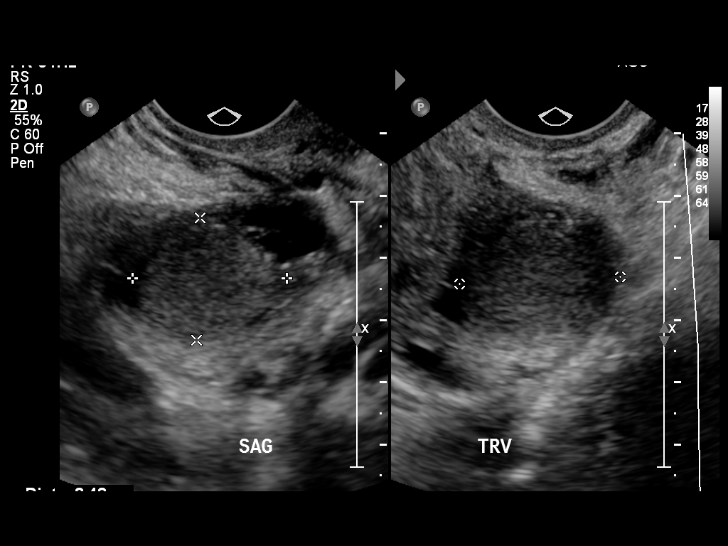
[im 58/64]
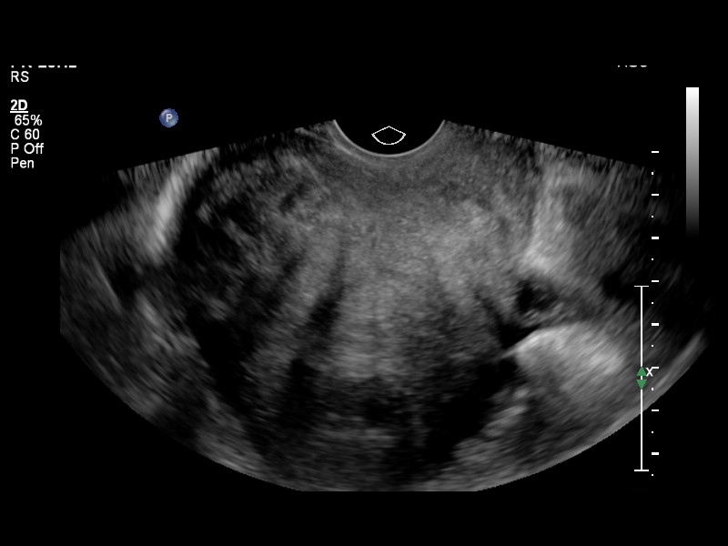
[im 64/64]
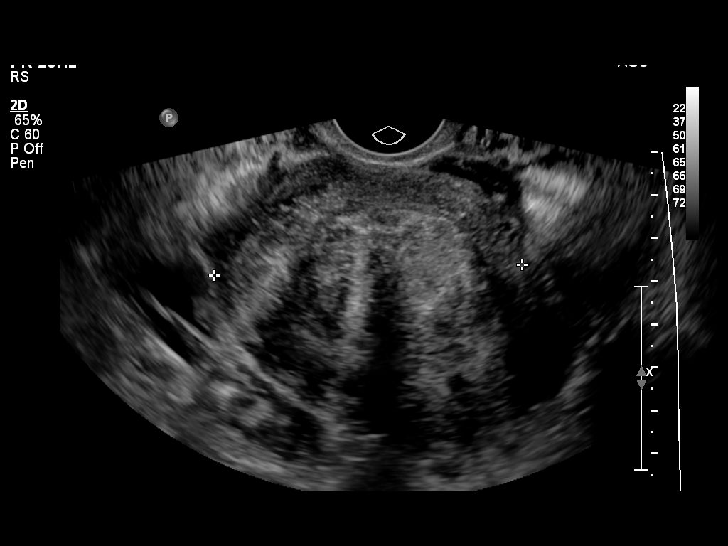

[13 of 25 positions shown; findings below may reference images not displayed]

FINDINGS: Uterus

Measurements: 9 x 10 x 11 cm. The fundus is abnormally enlarged by
numerous heterogeneous masses that are predominantly intramural. At
least 4 fibroids are present, the largest measuring approximately 4
cm in the uterine fundic region. There is distortion of the upper
endometrial cavity by the masses.

Endometrium

The upper endometrial cavity is distorted secondary to the multiple
fibroids. There is fluid within the upper endometrial cavity which
surrounds a 9 mm nodular, mildly heterogeneous structure. Color
Doppler was not applied to the endometrium.

Right ovary

Measurements: 5 x 3 x 3.6 cm. Normal appearance/no adnexal mass.

Left ovary

Measurements: 3.9 x 2.3 x 3.2 cm. Within the ovary is a 2.5 cm
nonvascular structure which has mid level echoes. No lace-like
septations or shadowing.

Other findings

No free fluid.
IMPRESSION: 1. Numerous uterine fibroids which are predominately intramural, but
do contact and distort the endometrium.
2. Fluid in the endometrial cavity outlines a 9 mm focal abnormality
which could reflect clot, polyp, or other mass.
3. 2.5 cm complex cyst in the left ovary, hemorrhagic cyst versus
endometrioma. Follow-up pelvic sonography could differentiate.

## 2016-10-24 ENCOUNTER — Ambulatory Visit (INDEPENDENT_AMBULATORY_CARE_PROVIDER_SITE_OTHER): Payer: 59

## 2016-10-24 ENCOUNTER — Ambulatory Visit (INDEPENDENT_AMBULATORY_CARE_PROVIDER_SITE_OTHER): Payer: 59 | Admitting: Physician Assistant

## 2016-10-24 DIAGNOSIS — G8929 Other chronic pain: Secondary | ICD-10-CM

## 2016-10-24 DIAGNOSIS — M25562 Pain in left knee: Secondary | ICD-10-CM | POA: Diagnosis not present

## 2016-10-24 MED ORDER — NAPROXEN 500 MG PO TABS: 500.0000 mg | ORAL_TABLET | Freq: Two times a day (BID) | ORAL | 3 refills | Status: DC

## 2016-10-24 NOTE — Progress Notes (Signed)
Office Visit Note   Patient: Janice Duran           Date of Birth: September 29, 1981           MRN: [id number] Visit Date: 10/24/2016              Requested by: No referring provider defined for this encounter. PCP: Pcp Not In System   Assessment & Plan: Visit Diagnoses:  1. Chronic pain of left knee     Plan: Supplemental injection in left knee. Handout on Visco supplementation given. The joint anti-inflammatories discussed with the patient will also give her a prescription for naproxen. Strengthening exercises discussed.  Follow-Up Instructions: Return in about 3 weeks (around 11/14/2016) for Supplemental injection.   Orders:  Orders Placed This Encounter  Procedures  . XR Knee 1-2 Views Left   No orders of the defined types were placed in this encounter.     Procedures: No procedures performed   Clinical Data: No additional findings.   Subjective: Chief Complaint  Patient presents with  . Left Knee - Pain    Patient has been going to [place] for her knee. She states she is [facility] on bone. [facility] to her [facility]. Wearing a bledsoe type knee brace    HPI  35 year old female with left knee pain that's becoming worse over the past 6 months. She reports a total of 5 surgeries on the knee. The last surgery was a knee arthroscopy in 2010. She's had 3 other knee arthroscopies for meniscal tears and an additional surgery for anterior cruciate  ligament repair.   She reports that her knee pain began with a basketball injury in high school meniscal tear and ACL tear requiring 2 knee surgeries. She then did well joint and the [employer] and while in [employer] she fell in a hole during a run and reinjured her knee requiring knee surgery. She was then medically discharged from the [employer].  Left knee pain is 10 out of 10 pain worse. She does find that naproxen helps she's tried cortisone injections which only give her about  a week of relief. She wears a  Bledsoe knee brace which helps somewhat the knee pain. 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 Her pain predominantly is medial aspect of the left knee.  Review of Systems See HPI  Objective: Vital Signs: There were no vitals taken for this visit.  Physical Exam  Constitutional: She is oriented to person, place, and time. She appears well-developed and well-nourished. No distress.  Eyes: EOM are normal.  Cardiovascular: Intact distal pulses.   Calves supple non tender  Pulmonary/Chest: Effort normal.  Neurological: She is alert and oriented to person, place, and time.  Skin: Skin is warm and dry.  Well healed surgical incisions.   Psychiatric: She has a normal mood and affect.    Ortho Exam Right knee full range of motion. Left knee full extension flexion to 100. Negative McMurray's bilaterally. Tenderness over the medial joint line left knee. No instability with valgus or varus stressing of either knee. Lachman's test negative bilaterally. Anterior drawer is negative bilaterally. No effusion of either knee. No abnormal warmth erythema or edema of either knee Specialty Comments:  No specialty comments available.  Imaging: Xr Knee 1-2 Views Left  Result Date: 10/24/2016 AP bilateral knees and lateral view of left knee: No acute fracture of either knee. No bony abnormalities outside of left  knee medial compartmental moderate narrowing and mild patellofemoral changes. Screws from anterior cruciate ligament repair present left knee without any evidence of hardware failure. Right Knee appears well preserved    PMFS History: Patient Active Problem List   Diagnosis Date Noted  . Acute deep vein thrombosis (DVT) of popliteal vein of right lower extremity ([place]) 10/20/2014  . Acute deep vein thrombosis (DVT) of tibial vein of right lower extremity ([place]) 10/20/2014  . Anemia associated with acute blood loss 10/20/2014  . Acute blood loss anemia 10/20/2014  . Fibroid  uterus 06/25/2014  . Menorrhagia 06/24/2014   Past Medical History:  Diagnosis Date  . Anemia   . Arthritis   . Blood transfusion without reported diagnosis   . Menorrhagia     No family history on file.  Past Surgical History:  Procedure Laterality Date  . KNEE ARTHROSCOPY W/ ACL RECONSTRUCTION Left   . KNEE SURGERY    . MENISCUS REPAIR Left   . ROBOTIC ASSISTED LAPAROSCOPIC VAGINAL HYSTERECTOMY WITH FIBROID REMOVAL Left    Social History   Occupational History  . Not on file.   Social History Main Topics  . Smoking status: Current Every Day Smoker    Packs/day: 0.50  . Smokeless tobacco: Not on file  . Alcohol use No  . Drug use: No  . Sexual activity: Not on file

## 2016-11-14 ENCOUNTER — Ambulatory Visit (INDEPENDENT_AMBULATORY_CARE_PROVIDER_SITE_OTHER): Payer: Self-pay | Admitting: Orthopaedic Surgery

## 2016-11-14 DIAGNOSIS — G8929 Other chronic pain: Secondary | ICD-10-CM

## 2016-11-14 DIAGNOSIS — M25562 Pain in left knee: Secondary | ICD-10-CM

## 2016-11-14 NOTE — Progress Notes (Signed)
The patient is here for a planned hyaluronic acid injection her left knee. She has chronic pain in the left knee after having injuries to that left knee at a younger age. Her last surgical intervention was 2010 she had a complete reconstruction of the ligaments of her knee. She has tried steroid injections and has failed conservative treatment measures. Our next recommendation was a hyaluronic acid injection in her left knee and she's here for this today having had improved to her insurance.  I showed her the injection expander risks and benefits of these injections. A clean the anterolateral aspect of her left knee with Betadine and alcohol and provided injection Monovisc to her left knee that she tolerated well. We'll see her back in 2 months to see if this is adding effect her at all.

## 2017-01-15 ENCOUNTER — Encounter (INDEPENDENT_AMBULATORY_CARE_PROVIDER_SITE_OTHER): Payer: Self-pay

## 2017-01-15 ENCOUNTER — Encounter (INDEPENDENT_AMBULATORY_CARE_PROVIDER_SITE_OTHER): Payer: Self-pay | Admitting: Orthopaedic Surgery

## 2017-01-15 ENCOUNTER — Ambulatory Visit (INDEPENDENT_AMBULATORY_CARE_PROVIDER_SITE_OTHER): Payer: 59 | Admitting: Physician Assistant

## 2017-01-15 DIAGNOSIS — M7052 Other bursitis of knee, left knee: Secondary | ICD-10-CM | POA: Diagnosis not present

## 2017-01-15 MED ORDER — METHYLPREDNISOLONE ACETATE 40 MG/ML IJ SUSP
40.0000 mg | INTRAMUSCULAR | Status: AC | PRN
  Administered 2017-01-15: 40 mg via INTRAMUSCULAR

## 2017-01-15 MED ORDER — LIDOCAINE HCL 1 % IJ SOLN
1.0000 mL | INTRAMUSCULAR | Status: AC | PRN
  Administered 2017-01-15: 1 mL

## 2017-01-15 MED ORDER — IBUPROFEN 200 MG PO TABS: 400.0000 mg | ORAL_TABLET | Freq: Four times a day (QID) | ORAL | 1 refills | Status: AC | PRN

## 2017-01-15 NOTE — Progress Notes (Signed)
   Office Visit Note   Patient: Janice Duran           Date of Birth: 09/11/1981           MRN: [id number] Visit Date: 01/15/2017              Requested by: No referring provider defined for this encounter. PCP: Pcp Not In System   Assessment & Plan: Visit Diagnoses:  1. Pes anserinus bursitis of left knee     Plan: Continue to work on strengthening range of motion of the left knee. Continued tumeric and ibuprofen.  Follow-Up Instructions: Return in about 2 weeks (around 01/29/2017).   Orders:  Orders Placed This Encounter  Procedures  . Trigger Point Injection   No orders of the defined types were placed in this encounter.     Procedures: Trigger Point Inj Date/Time: 01/15/2017 9:07 AM Performed by: Pete Pelt Authorized by: Pete Pelt   Consent Given by:  Patient Indications:  Pain Total # of Trigger Points:  1 Location: lower extremity   Approach:  Medial Medications #1:  1 mL lidocaine 1 %; 40 mg methylPREDNISolone acetate 40 MG/ML     Clinical Data: No additional findings.   Subjective: Chief Complaint  Patient presents with  . Left Knee - Follow-up  . Follow-up    HPI Janice Duran returns today stating that her left knee pain really has not resolved with the monopolar disc injection.. She points the medial aspect of the knee as her main pain. She is doing leg exercises at least once a week on her own. At therapy with the ligament reconstruction in her knee in the past and really feels shewhat she is doing as far strengthening the leg. She is currently taking Tumeric for the last 2 weeks. Review of Systems   Objective: Vital Signs: There were no vitals taken for this visit.  Physical Exam  Ortho Exam If knee excellent range of motion and no effusion. No instability valgus varus stressing. Tenderness only over the pes anserinus area and no tenderness over the medial or lateral joint line. Specialty Comments:  No specialty comments  available.  Imaging: No results found.   PMFS History: Patient Active Problem List   Diagnosis Date Noted  . Acute deep vein thrombosis (DVT) of popliteal vein of right lower extremity ([place]) 10/20/2014  . Acute deep vein thrombosis (DVT) of tibial vein of right lower extremity ([place]) 10/20/2014  . Anemia associated with acute blood loss 10/20/2014  . Acute blood loss anemia 10/20/2014  . Fibroid uterus 06/25/2014  . Menorrhagia 06/24/2014   Past Medical History:  Diagnosis Date  . Anemia   . Arthritis   . Blood transfusion without reported diagnosis   . Menorrhagia     No family history on file.  Past Surgical History:  Procedure Laterality Date  . KNEE ARTHROSCOPY W/ ACL RECONSTRUCTION Left   . KNEE SURGERY    . MENISCUS REPAIR Left   . ROBOTIC ASSISTED LAPAROSCOPIC VAGINAL HYSTERECTOMY WITH FIBROID REMOVAL Left    Social History   Occupational History  . Not on file.   Social History Main Topics  . Smoking status: Former Smoker    Packs/day: 0.50  . Smokeless tobacco: Never Used  . Alcohol use No  . Drug use: No  . Sexual activity: Not on file

## 2017-01-30 ENCOUNTER — Ambulatory Visit (INDEPENDENT_AMBULATORY_CARE_PROVIDER_SITE_OTHER): Payer: 59 | Admitting: Orthopaedic Surgery

## 2017-02-06 ENCOUNTER — Ambulatory Visit (INDEPENDENT_AMBULATORY_CARE_PROVIDER_SITE_OTHER): Payer: 59 | Admitting: Orthopaedic Surgery

## 2017-02-06 DIAGNOSIS — M7052 Other bursitis of knee, left knee: Secondary | ICD-10-CM | POA: Diagnosis not present

## 2017-02-06 MED ORDER — DICLOFENAC SODIUM 1 % TD GEL: 2.0000 g | Freq: Four times a day (QID) | TRANSDERMAL | 3 refills | Status: AC

## 2017-02-06 NOTE — Progress Notes (Signed)
The patient is following up with continued left knee pain. We had diagnosed her with pes bursitis of her left knee. She is someone who is actually had 5 operations on this knee in the past. She still points to the medial joint line but in the area of the pes anserinus bursa as a source of her pain. We provided injection in this area at her last visit and she said that really didn't help much.  She does walk with a limp. She has full range of motion of her knee and there is well-healed surgical scars from multiple surgeries on her left knee. She has significant pain more over the pes bursa than the joint line itself.  At this point I'll like to send her to outpatient physical therapy and try combination of Voltaren gel to see if this will help. We'll see her back in about 3 weeks and if this is not improved we'll consider an MRI of her left knee.

## 2017-02-27 ENCOUNTER — Ambulatory Visit (INDEPENDENT_AMBULATORY_CARE_PROVIDER_SITE_OTHER): Payer: 59 | Admitting: Orthopaedic Surgery

## 2017-04-15 ENCOUNTER — Encounter (HOSPITAL_COMMUNITY): Payer: Self-pay | Admitting: *Deleted

## 2017-04-15 ENCOUNTER — Ambulatory Visit (HOSPITAL_COMMUNITY)
Admission: EM | Admit: 2017-04-15 | Discharge: 2017-04-15 | Disposition: A | Discharge status: Home / Self Care | Payer: 59 | Attending: Internal Medicine | Admitting: Internal Medicine

## 2017-04-15 DIAGNOSIS — K529 Noninfective gastroenteritis and colitis, unspecified: Secondary | ICD-10-CM

## 2017-04-15 MED ORDER — ONDANSETRON 4 MG PO TBDP
ORAL_TABLET | ORAL | Status: AC
  Filled 2017-04-15: qty 1

## 2017-04-15 MED ORDER — ONDANSETRON HCL 4 MG PO TABS: 8.0000 mg | ORAL_TABLET | ORAL | 0 refills | Status: AC | PRN

## 2017-04-15 MED ORDER — ONDANSETRON 4 MG PO TBDP
8.0000 mg | ORAL_TABLET | Freq: Once | ORAL | Status: AC
  Administered 2017-04-15: 8 mg via ORAL

## 2017-04-15 MED ORDER — FAMOTIDINE 40 MG PO TABS: 40.0000 mg | ORAL_TABLET | Freq: Two times a day (BID) | ORAL | 0 refills | Status: AC

## 2017-04-15 NOTE — ED Provider Notes (Signed)
[facility]    CSN: [id number] Arrival date & time: 04/15/17  1035     History   Chief Complaint Chief Complaint  Patient presents with  . Abdominal Pain    HPI Janice Duran is a 36 y.o. female. She presents today with onset of epigastric discomfort, feels hungry, with vomiting, 2 nights ago. Had several episodes of vomiting at onset of symptoms, decreased since. Discomfort radiates through the back. No hematemesis, no coffee-ground emesis. Is able to tolerate sips of ginger ale and tea. Feels worse laying flat, or riding in the car/in motion. No urinary discomfort, no frequency, no urgency. No unusual vaginal discharge or bleeding. Not sexually active. No diarrhea. No fever.    HPI  Past Medical History:  Diagnosis Date  . Anemia   . Arthritis   . Blood transfusion without reported diagnosis   . Menorrhagia     Patient Active Problem List   Diagnosis Date Noted  . Pes anserinus bursitis of left knee 02/06/2017  . Acute deep vein thrombosis (DVT) of popliteal vein of right lower extremity ([place]) 10/20/2014  . Acute deep vein thrombosis (DVT) of tibial vein of right lower extremity ([place]) 10/20/2014  . Anemia associated with acute blood loss 10/20/2014  . Acute blood loss anemia 10/20/2014  . Fibroid uterus 06/25/2014  . Menorrhagia 06/24/2014    Past Surgical History:  Procedure Laterality Date  . KNEE ARTHROSCOPY W/ ACL RECONSTRUCTION Left   . KNEE SURGERY    . MENISCUS REPAIR Left   . ROBOTIC ASSISTED LAPAROSCOPIC VAGINAL HYSTERECTOMY WITH FIBROID REMOVAL Left        Home Medications    Prior to Admission medications   Medication Sig Start Date End Date Taking? Authorizing Provider  diclofenac sodium (VOLTAREN) 1 % GEL Apply 2 g topically 4 (four) times daily. 02/06/17   Mcarthur Rossetti, MD  famotidine (PEPCID) 40 MG tablet Take 1 tablet (40 mg total) by mouth 2 (two) times daily. 04/15/17 04/29/17  Sherlene Shams, MD  ibuprofen  (ADVIL,MOTRIN) 200 MG tablet Take 2 tablets (400 mg total) by mouth every 6 (six) hours as needed for moderate pain (pain). 01/15/17   Pete Pelt, PA-C  ondansetron (ZOFRAN) 4 MG tablet Take 2 tablets (8 mg total) by mouth every 4 (four) hours as needed for nausea or vomiting. 04/15/17   Sherlene Shams, MD    Family History History reviewed. No pertinent family history.  Social History Social History  Substance Use Topics  . Smoking status: Former Smoker    Packs/day: 0.50  . Smokeless tobacco: Never Used  . Alcohol use No     Allergies   Patient has no known allergies.   Review of Systems Review of Systems  All other systems reviewed and are negative.    Physical Exam Triage Vital Signs ED Triage Vitals [04/15/17 1134]  Enc Vitals Group     BP 104/70     Pulse Rate 70     Resp 14     Temp 98.6 F (37 C)     Temp Source Oral     SpO2 98 %     Weight      Height      Pain Score 6     Pain Loc    No data found.   Updated Vital Signs BP 104/70 (BP Location: Right Arm)   Pulse 70   Temp 98.6 F (37 C) (Oral)   Resp 14   LMP  03/24/2017   SpO2 98%   Physical Exam  Constitutional: She is oriented to person, place, and time.  Laying down on exam table, able to sit up for exam  HENT:  Head: Atraumatic.  Eyes:  Conjugate gaze observed, no eye redness/discharge  Neck: Neck supple.  Cardiovascular: Normal rate and regular rhythm.   Pulmonary/Chest: No respiratory distress. She has no wheezes. She has no rales.  Lungs clear, symmetric breath sounds  Abdominal: Soft. She exhibits no distension. There is no rebound and no guarding.  Mild focal tenderness to deep palpation in the epigastrium  Musculoskeletal: Normal range of motion.  Neurological: She is alert and oriented to person, place, and time.  Skin: Skin is warm and dry.  Nursing note and vitals reviewed.    UC Treatments / Results   Procedures Procedures (including critical care  time)  Medications Ordered in UC Medications  ondansetron (ZOFRAN-ODT) disintegrating tablet 8 mg (8 mg Oral Given 04/15/17 1156)   Nausea improved after dose of Zofran.    Final Clinical Impressions(s) / UC Diagnoses   Final diagnoses:  Acute gastroenteritis   Symptoms today seem most consistent with a stomach bug, but cannot completely rule out gastritis/stomach acid issue or mild pancreatitis.  Anticipate gradual improvement in stomach upset over the next several days.  Recheck or go to ED if increased vomiting or not able to keep down fluids.  Prescriptions for famotidine (for stomach acid) and ondansetron (for nausea/vomiting) were sent to the [facility] on [address].  Push fluids; diet as tolerated.    New Prescriptions Discharge Medication List as of 04/15/2017 12:36 PM    START taking these medications   Details  famotidine (PEPCID) 40 MG tablet Take 1 tablet (40 mg total) by mouth 2 (two) times daily., Starting Mon 04/15/2017, Until Mon 04/29/2017, Normal    ondansetron (ZOFRAN) 4 MG tablet Take 2 tablets (8 mg total) by mouth every 4 (four) hours as needed for nausea or vomiting., Starting Mon 04/15/2017, Normal         Sherlene Shams, MD 04/19/17 [phone]

## 2017-04-15 NOTE — ED Triage Notes (Signed)
abd pain  Nauseated   Vomited        Upper  abd      Pain   denys    Any        Diarrhea   Or  Constipation

## 2017-04-15 NOTE — Discharge Instructions (Addendum)
Symptoms today seem most consistent with a stomach bug, but cannot completely rule out gastritis/stomach acid issue or mild pancreatitis.  Anticipate gradual improvement in stomach upset over the next several days.  Recheck or go to ED if increased vomiting or not able to keep down fluids.  Prescriptions for famotidine (for stomach acid) and ondansetron (for nausea/vomiting) were sent to the [facility] on [address].  Push fluids; diet as tolerated.
